# Patient Record
Sex: Female | Born: 1970
Health system: Southern US, Community
[De-identification: ages and names within clinical notes are randomized; demographics above are authoritative.]

## PROBLEM LIST (undated history)

## (undated) DIAGNOSIS — E119 Type 2 diabetes mellitus without complications: Secondary | ICD-10-CM

## (undated) HISTORY — PX: ABDOMINAL HYSTERECTOMY: SHX81

## (undated) HISTORY — PX: NO PAST SURGERIES: SHX2092

---

## 1997-10-27 ENCOUNTER — Emergency Department (HOSPITAL_COMMUNITY): Admission: EM | Admit: 1997-10-27 | Discharge: 1997-10-27 | Payer: Self-pay | Admitting: Emergency Medicine

## 1997-10-31 ENCOUNTER — Emergency Department (HOSPITAL_COMMUNITY): Admission: EM | Admit: 1997-10-31 | Discharge: 1997-11-01 | Payer: Self-pay | Admitting: Emergency Medicine

## 1997-12-10 ENCOUNTER — Emergency Department (HOSPITAL_COMMUNITY): Admission: EM | Admit: 1997-12-10 | Discharge: 1997-12-10 | Payer: Self-pay | Admitting: Emergency Medicine

## 1999-09-01 ENCOUNTER — Emergency Department (HOSPITAL_COMMUNITY): Admission: EM | Admit: 1999-09-01 | Discharge: 1999-09-01 | Payer: Self-pay | Admitting: Emergency Medicine

## 2000-03-17 ENCOUNTER — Emergency Department (HOSPITAL_COMMUNITY): Admission: EM | Admit: 2000-03-17 | Discharge: 2000-03-17 | Payer: Self-pay | Admitting: Emergency Medicine

## 2000-03-17 ENCOUNTER — Encounter: Payer: Self-pay | Admitting: Emergency Medicine

## 2000-03-21 ENCOUNTER — Emergency Department (HOSPITAL_COMMUNITY): Admission: EM | Admit: 2000-03-21 | Discharge: 2000-03-21 | Payer: Self-pay | Admitting: Emergency Medicine

## 2001-06-09 ENCOUNTER — Encounter: Payer: Self-pay | Admitting: Obstetrics and Gynecology

## 2001-06-09 ENCOUNTER — Ambulatory Visit (HOSPITAL_COMMUNITY): Admission: RE | Admit: 2001-06-09 | Discharge: 2001-06-09 | Payer: Self-pay | Admitting: Obstetrics and Gynecology

## 2002-06-01 ENCOUNTER — Other Ambulatory Visit: Admission: RE | Admit: 2002-06-01 | Discharge: 2002-06-01 | Payer: Self-pay | Admitting: Obstetrics and Gynecology

## 2002-10-20 ENCOUNTER — Emergency Department (HOSPITAL_COMMUNITY): Admission: EM | Admit: 2002-10-20 | Discharge: 2002-10-20 | Payer: Self-pay | Admitting: Unknown Physician Specialty

## 2004-05-29 ENCOUNTER — Emergency Department (HOSPITAL_COMMUNITY): Admission: EM | Admit: 2004-05-29 | Discharge: 2004-05-29 | Payer: Self-pay | Admitting: Family Medicine

## 2006-01-18 ENCOUNTER — Emergency Department (HOSPITAL_COMMUNITY): Admission: EM | Admit: 2006-01-18 | Discharge: 2006-01-19 | Payer: Self-pay | Admitting: Emergency Medicine

## 2006-09-03 ENCOUNTER — Emergency Department (HOSPITAL_COMMUNITY): Admission: EM | Admit: 2006-09-03 | Discharge: 2006-09-03 | Payer: Self-pay | Admitting: Emergency Medicine

## 2008-02-01 ENCOUNTER — Emergency Department (HOSPITAL_COMMUNITY): Admission: EM | Admit: 2008-02-01 | Discharge: 2008-02-01 | Payer: Self-pay | Admitting: Family Medicine

## 2008-07-03 ENCOUNTER — Emergency Department (HOSPITAL_COMMUNITY): Admission: EM | Admit: 2008-07-03 | Discharge: 2008-07-04 | Payer: Self-pay | Admitting: Emergency Medicine

## 2008-08-20 ENCOUNTER — Emergency Department (HOSPITAL_COMMUNITY): Admission: EM | Admit: 2008-08-20 | Discharge: 2008-08-20 | Payer: Self-pay | Admitting: Emergency Medicine

## 2009-02-07 ENCOUNTER — Emergency Department (HOSPITAL_COMMUNITY): Admission: EM | Admit: 2009-02-07 | Discharge: 2009-02-07 | Payer: Self-pay | Admitting: Emergency Medicine

## 2009-08-06 ENCOUNTER — Emergency Department (HOSPITAL_COMMUNITY): Admission: EM | Admit: 2009-08-06 | Discharge: 2009-08-06 | Payer: Self-pay | Admitting: Emergency Medicine

## 2010-05-24 ENCOUNTER — Encounter: Payer: Self-pay | Admitting: Obstetrics and Gynecology

## 2010-08-06 LAB — DIFFERENTIAL
Basophils Relative: 1 % (ref 0–1)
Eosinophils Relative: 8 % — ABNORMAL HIGH (ref 0–5)
Lymphocytes Relative: 12 % (ref 12–46)
Lymphs Abs: 1.3 10*3/uL (ref 0.7–4.0)
Monocytes Absolute: 0.4 10*3/uL (ref 0.1–1.0)

## 2010-08-06 LAB — CBC
MCV: 84.4 fL (ref 78.0–100.0)
RBC: 5.06 MIL/uL (ref 3.87–5.11)

## 2010-08-06 LAB — BASIC METABOLIC PANEL
CO2: 27 mEq/L (ref 19–32)
GFR calc Af Amer: >60 mL/min (ref >60)
Sodium: 137 mEq/L (ref 135–145)

## 2010-08-13 LAB — RAPID STREP SCREEN (MED CTR MEBANE ONLY): Streptococcus, Group A Screen (Direct): POSITIVE — AB

## 2010-08-28 ENCOUNTER — Emergency Department (HOSPITAL_COMMUNITY)
Admission: EM | Admit: 2010-08-28 | Discharge: 2010-08-28 | Disposition: A | Discharge status: Home / Self Care | Payer: 59 | Attending: Emergency Medicine | Admitting: Emergency Medicine

## 2010-08-28 ENCOUNTER — Emergency Department (HOSPITAL_COMMUNITY): Payer: 59

## 2010-08-28 DIAGNOSIS — R0989 Other specified symptoms and signs involving the circulatory and respiratory systems: Secondary | ICD-10-CM | POA: Insufficient documentation

## 2010-08-28 DIAGNOSIS — R110 Nausea: Secondary | ICD-10-CM | POA: Insufficient documentation

## 2010-08-28 DIAGNOSIS — R079 Chest pain, unspecified: Secondary | ICD-10-CM | POA: Insufficient documentation

## 2010-08-28 DIAGNOSIS — R0609 Other forms of dyspnea: Secondary | ICD-10-CM | POA: Insufficient documentation

## 2010-08-28 LAB — DIFFERENTIAL
Basophils Absolute: 0.1 10*3/uL (ref 0.0–0.1)
Eosinophils Absolute: 0.5 10*3/uL (ref 0.0–0.7)
Lymphocytes Relative: 24 % (ref 12–46)
Lymphs Abs: 1.6 10*3/uL (ref 0.7–4.0)
Monocytes Absolute: 0.5 10*3/uL (ref 0.1–1.0)
Monocytes Relative: 7 % (ref 3–12)
Neutro Abs: 4 10*3/uL (ref 1.7–7.7)
Neutrophils Relative %: 60 % (ref 43–77)

## 2010-08-28 LAB — CBC
HCT: 44.5 % (ref 36.0–46.0)
MCHC: 33.3 g/dL (ref 30.0–36.0)
MCV: 82.3 fL (ref 78.0–100.0)
RBC: 5.41 MIL/uL — ABNORMAL HIGH (ref 3.87–5.11)
RDW: 14.5 % (ref 11.5–15.5)

## 2010-08-28 LAB — CK TOTAL AND CKMB (NOT AT ARMC)
CK, MB: 1.1 ng/mL (ref 0.3–4.0)
Relative Index: 1 (ref 0.0–2.5)

## 2010-08-28 LAB — COMPREHENSIVE METABOLIC PANEL
GFR calc Af Amer: >60 mL/min (ref >60)
GFR calc non Af Amer: >60 mL/min (ref >60)
Sodium: 137 mEq/L (ref 135–145)
Total Bilirubin: 0.7 mg/dL (ref 0.3–1.2)
Total Protein: 7.6 g/dL (ref 6.0–8.3)

## 2010-11-26 ENCOUNTER — Encounter (HOSPITAL_COMMUNITY): Payer: Self-pay

## 2010-11-26 ENCOUNTER — Encounter (HOSPITAL_COMMUNITY)
Admission: RE | Admit: 2010-11-26 | Discharge: 2010-11-26 | Disposition: A | Discharge status: Home / Self Care | Payer: 59 | Source: Ambulatory Visit | Attending: Obstetrics and Gynecology | Admitting: Obstetrics and Gynecology

## 2010-11-26 LAB — CBC
Hemoglobin: 14.3 g/dL (ref 12.0–15.0)
MCH: 28 pg (ref 26.0–34.0)
Platelets: 199 10*3/uL (ref 150–400)
WBC: 6.4 10*3/uL (ref 4.0–10.5)

## 2010-11-26 MED ORDER — METRONIDAZOLE IN NACL 5-0.79 MG/ML-% IV SOLN: 500.0000 mg | Freq: Three times a day (TID) | INTRAVENOUS | Status: DC

## 2010-11-26 MED ORDER — CEFAZOLIN SODIUM 1-5 GM-% IV SOLN: 1.0000 g | Freq: Once | INTRAVENOUS | Status: DC

## 2010-11-26 NOTE — Patient Instructions (Addendum)
[name]  11/26/2010   Your procedure is scheduled on:  11/30/10  Report to [facility] at 1130 AM.  Call this number if you have problems the morning of surgery: [phone]   Remember:   Do not eat food:After Midnight.  Do not drink clear liquids: 4 Hours before arrival.  Take these medicines the morning of surgery with A SIP OF WATER: NA   Do not wear jewelry, make-up or nail polish.  Do not bring valuables to the hospital.  Contacts, dentures or bridgework may not be worn into surgery.  Leave suitcase in the car. After surgery it may be brought to your room.  For patients admitted to the hospital, checkout time is 11:00 AM the day of discharge.   Patients discharged the day of surgery will not be allowed to drive home.  Name and phone number of your driver: NA  Special Instructions: CHG wash 1/2 bottle PM before surgery and 1/2 bottle AM of surgery   Please read over the following fact sheets that you were given: Pain Booklet and Surgical Site Infection Prevention

## 2010-11-29 MED ORDER — CEFAZOLIN SODIUM-DEXTROSE 2-3 GM-% IV SOLR
2.0000 g | INTRAVENOUS | Status: DC
  Filled 2010-11-29: qty 50

## 2010-11-29 MED ORDER — METRONIDAZOLE IN NACL 5-0.79 MG/ML-% IV SOLN
500.0000 mg | Freq: Once | INTRAVENOUS | Status: DC
  Filled 2010-11-29: qty 100

## 2010-11-30 ENCOUNTER — Ambulatory Visit (HOSPITAL_COMMUNITY)
Admission: RE | Admit: 2010-11-30 | Discharge: 2010-12-01 | Disposition: A | Discharge status: Home / Self Care | Payer: 59 | Source: Ambulatory Visit | Attending: Obstetrics and Gynecology | Admitting: Obstetrics and Gynecology

## 2010-11-30 ENCOUNTER — Other Ambulatory Visit: Payer: Self-pay | Admitting: Obstetrics and Gynecology

## 2010-11-30 ENCOUNTER — Encounter (HOSPITAL_COMMUNITY)
Admission: RE | Disposition: A | Discharge status: Home / Self Care | Payer: Self-pay | Source: Ambulatory Visit | Attending: Obstetrics and Gynecology

## 2010-11-30 ENCOUNTER — Encounter (HOSPITAL_COMMUNITY): Payer: Self-pay | Admitting: Anesthesiology

## 2010-11-30 ENCOUNTER — Ambulatory Visit (HOSPITAL_COMMUNITY): Payer: 59 | Admitting: Anesthesiology

## 2010-11-30 DIAGNOSIS — N946 Dysmenorrhea, unspecified: Principal | ICD-10-CM | POA: Insufficient documentation

## 2010-11-30 DIAGNOSIS — N920 Excessive and frequent menstruation with regular cycle: Secondary | ICD-10-CM | POA: Insufficient documentation

## 2010-11-30 DIAGNOSIS — Z90710 Acquired absence of both cervix and uterus: Secondary | ICD-10-CM | POA: Diagnosis not present

## 2010-11-30 DIAGNOSIS — N7011 Chronic salpingitis: Secondary | ICD-10-CM | POA: Diagnosis present

## 2010-11-30 DIAGNOSIS — Z01818 Encounter for other preprocedural examination: Secondary | ICD-10-CM | POA: Insufficient documentation

## 2010-11-30 DIAGNOSIS — N871 Moderate cervical dysplasia: Secondary | ICD-10-CM | POA: Insufficient documentation

## 2010-11-30 DIAGNOSIS — Z01812 Encounter for preprocedural laboratory examination: Secondary | ICD-10-CM | POA: Insufficient documentation

## 2010-11-30 DIAGNOSIS — N736 Female pelvic peritoneal adhesions (postinfective): Secondary | ICD-10-CM | POA: Diagnosis present

## 2010-11-30 DIAGNOSIS — N7013 Chronic salpingitis and oophoritis: Secondary | ICD-10-CM | POA: Insufficient documentation

## 2010-11-30 SURGERY — ROBOTIC ASSISTED TOTAL HYSTERECTOMY
Anesthesia: General | Site: Uterus | Wound class: Clean Contaminated

## 2010-11-30 MED ORDER — ROCURONIUM BROMIDE 100 MG/10ML IV SOLN
INTRAVENOUS | Status: DC | PRN
  Administered 2010-11-30 (×3): 10 mg via INTRAVENOUS
  Administered 2010-11-30: 45 mg via INTRAVENOUS
  Administered 2010-11-30: 5 mg via INTRAVENOUS

## 2010-11-30 MED ORDER — LIDOCAINE HCL (CARDIAC) 20 MG/ML IV SOLN
INTRAVENOUS | Status: DC | PRN
  Administered 2010-11-30: 100 mg via INTRAVENOUS

## 2010-11-30 MED ORDER — DIPHENHYDRAMINE HCL 12.5 MG/5ML PO ELIX: 12.5000 mg | ORAL_SOLUTION | Freq: Four times a day (QID) | ORAL | Status: DC | PRN

## 2010-11-30 MED ORDER — CEFAZOLIN SODIUM 1-5 GM-% IV SOLN
INTRAVENOUS | Status: AC
  Filled 2010-11-30: qty 50

## 2010-11-30 MED ORDER — CITRIC ACID-SODIUM CITRATE 334-500 MG/5ML PO SOLN: 30.0000 mL | Freq: Once | ORAL | Status: DC | PRN

## 2010-11-30 MED ORDER — OXYCODONE-ACETAMINOPHEN 5-325 MG PO TABS
1.0000 | ORAL_TABLET | ORAL | Status: DC | PRN
  Administered 2010-12-01: 2 via ORAL
  Filled 2010-11-30: qty 2

## 2010-11-30 MED ORDER — INSULIN REGULAR HUMAN 100 UNIT/ML IJ SOLN
INTRAMUSCULAR | Status: AC
  Filled 2010-11-30: qty 10

## 2010-11-30 MED ORDER — ACETAMINOPHEN 10 MG/ML IV SOLN
1000.0000 mg | Freq: Once | INTRAVENOUS | Status: AC | PRN
  Administered 2010-11-30: 1000 mg via INTRAVENOUS
  Filled 2010-11-30: qty 100

## 2010-11-30 MED ORDER — NEOSTIGMINE METHYLSULFATE 1 MG/ML IJ SOLN
INTRAMUSCULAR | Status: AC
  Filled 2010-11-30: qty 10

## 2010-11-30 MED ORDER — BISACODYL 10 MG RE SUPP: 10.0000 mg | Freq: Every day | RECTAL | Status: DC | PRN

## 2010-11-30 MED ORDER — MEPERIDINE HCL 25 MG/ML IJ SOLN: 6.2500 mg | INTRAMUSCULAR | Status: DC | PRN

## 2010-11-30 MED ORDER — BUPIVACAINE HCL (PF) 0.25 % IJ SOLN
INTRAMUSCULAR | Status: DC | PRN
  Administered 2010-11-30: 17 mg

## 2010-11-30 MED ORDER — HYDROMORPHONE 0.3 MG/ML IV SOLN
INTRAVENOUS | Status: AC
  Filled 2010-11-30: qty 25

## 2010-11-30 MED ORDER — ACETAMINOPHEN 325 MG PO TABS: 325.0000 mg | ORAL_TABLET | ORAL | Status: DC | PRN

## 2010-11-30 MED ORDER — PANTOPRAZOLE SODIUM 40 MG PO TBEC
40.0000 mg | DELAYED_RELEASE_TABLET | Freq: Every day | ORAL | Status: DC
  Administered 2010-11-30: 40 mg via ORAL
  Filled 2010-11-30 (×2): qty 1

## 2010-11-30 MED ORDER — ONDANSETRON HCL 4 MG/2ML IJ SOLN
INTRAMUSCULAR | Status: AC
  Filled 2010-11-30: qty 2

## 2010-11-30 MED ORDER — FAMOTIDINE 20 MG PO TABS: 20.0000 mg | ORAL_TABLET | Freq: Once | ORAL | Status: DC | PRN

## 2010-11-30 MED ORDER — PROMETHAZINE HCL 25 MG/ML IJ SOLN: 6.2500 mg | INTRAMUSCULAR | Status: DC | PRN

## 2010-11-30 MED ORDER — KETOROLAC TROMETHAMINE 60 MG/2ML IM SOLN
INTRAMUSCULAR | Status: AC
  Filled 2010-11-30: qty 2

## 2010-11-30 MED ORDER — LIDOCAINE HCL (CARDIAC) 20 MG/ML IV SOLN
INTRAVENOUS | Status: AC
  Filled 2010-11-30: qty 5

## 2010-11-30 MED ORDER — KETOROLAC TROMETHAMINE 30 MG/ML IJ SOLN
INTRAMUSCULAR | Status: DC | PRN
  Administered 2010-11-30: 30 mg via INTRAVENOUS

## 2010-11-30 MED ORDER — GLYCOPYRROLATE 0.2 MG/ML IJ SOLN
INTRAMUSCULAR | Status: AC
  Filled 2010-11-30: qty 1

## 2010-11-30 MED ORDER — CEFAZOLIN SODIUM 1-5 GM-% IV SOLN
INTRAVENOUS | Status: DC | PRN
  Administered 2010-11-30: 2 g via INTRAVENOUS

## 2010-11-30 MED ORDER — ONDANSETRON HCL 4 MG PO TABS: 4.0000 mg | ORAL_TABLET | Freq: Four times a day (QID) | ORAL | Status: DC | PRN

## 2010-11-30 MED ORDER — SENNOSIDES-DOCUSATE SODIUM 8.6-50 MG PO TABS: 2.0000 | ORAL_TABLET | Freq: Every day | ORAL | Status: DC | PRN

## 2010-11-30 MED ORDER — HYDROMORPHONE HCL 1 MG/ML IJ SOLN: 0.2000 mg | INTRAMUSCULAR | Status: DC | PRN

## 2010-11-30 MED ORDER — SIMETHICONE 80 MG PO CHEW: 80.0000 mg | CHEWABLE_TABLET | Freq: Four times a day (QID) | ORAL | Status: DC | PRN

## 2010-11-30 MED ORDER — NEOSTIGMINE METHYLSULFATE 1 MG/ML IJ SOLN
INTRAMUSCULAR | Status: DC | PRN
  Administered 2010-11-30: 3 mg via INTRAMUSCULAR

## 2010-11-30 MED ORDER — NALOXONE HCL 0.4 MG/ML IJ SOLN: 0.4000 mg | INTRAMUSCULAR | Status: DC | PRN

## 2010-11-30 MED ORDER — HYDROMORPHONE HCL 1 MG/ML IJ SOLN
0.2500 mg | INTRAMUSCULAR | Status: DC | PRN
  Administered 2010-11-30 (×2): 0.5 mg via INTRAVENOUS

## 2010-11-30 MED ORDER — METRONIDAZOLE IN NACL 5-0.79 MG/ML-% IV SOLN
INTRAVENOUS | Status: DC | PRN
  Administered 2010-11-30: 500 mg via INTRAVENOUS

## 2010-11-30 MED ORDER — DEXTROSE IN LACTATED RINGERS 5 % IV SOLN
INTRAVENOUS | Status: DC
  Administered 2010-11-30 – 2010-12-01 (×2): via INTRAVENOUS

## 2010-11-30 MED ORDER — PANTOPRAZOLE SODIUM 40 MG PO TBEC: 40.0000 mg | DELAYED_RELEASE_TABLET | Freq: Once | ORAL | Status: DC | PRN

## 2010-11-30 MED ORDER — ONDANSETRON HCL 4 MG/2ML IJ SOLN
INTRAMUSCULAR | Status: DC | PRN
  Administered 2010-11-30: 4 mg via INTRAVENOUS

## 2010-11-30 MED ORDER — PHENYLEPHRINE 40 MCG/ML (10ML) SYRINGE FOR IV PUSH (FOR BLOOD PRESSURE SUPPORT)
PREFILLED_SYRINGE | INTRAVENOUS | Status: AC
  Filled 2010-11-30: qty 5

## 2010-11-30 MED ORDER — GLYCOPYRROLATE 0.2 MG/ML IJ SOLN
INTRAMUSCULAR | Status: DC | PRN
  Administered 2010-11-30: 0.2 mg via INTRAVENOUS
  Administered 2010-11-30: .4 mg via INTRAVENOUS

## 2010-11-30 MED ORDER — DIPHENHYDRAMINE HCL 50 MG/ML IJ SOLN: 12.5000 mg | Freq: Four times a day (QID) | INTRAMUSCULAR | Status: DC | PRN

## 2010-11-30 MED ORDER — HYDROMORPHONE HCL 1 MG/ML IJ SOLN
INTRAMUSCULAR | Status: AC
  Administered 2010-11-30: 0.5 mg via INTRAVENOUS
  Filled 2010-11-30: qty 1

## 2010-11-30 MED ORDER — ALUM & MAG HYDROXIDE-SIMETH 200-200-20 MG/5ML PO SUSP: 30.0000 mL | ORAL | Status: DC | PRN

## 2010-11-30 MED ORDER — KETOROLAC TROMETHAMINE 30 MG/ML IJ SOLN
INTRAMUSCULAR | Status: AC
  Filled 2010-11-30: qty 1

## 2010-11-30 MED ORDER — MIDAZOLAM HCL 2 MG/2ML IJ SOLN
INTRAMUSCULAR | Status: AC
  Filled 2010-11-30: qty 2

## 2010-11-30 MED ORDER — ROCURONIUM BROMIDE 50 MG/5ML IV SOLN
INTRAVENOUS | Status: AC
  Filled 2010-11-30: qty 1

## 2010-11-30 MED ORDER — CEFAZOLIN SODIUM 1-5 GM-% IV SOLN
1.0000 g | Freq: Three times a day (TID) | INTRAVENOUS | Status: AC
  Administered 2010-11-30 – 2010-12-01 (×2): 1 g via INTRAVENOUS
  Filled 2010-11-30 (×2): qty 50

## 2010-11-30 MED ORDER — LACTATED RINGERS IV SOLN
INTRAVENOUS | Status: DC
  Administered 2010-11-30 (×4): via INTRAVENOUS

## 2010-11-30 MED ORDER — METRONIDAZOLE IN NACL 5-0.79 MG/ML-% IV SOLN
500.0000 mg | Freq: Two times a day (BID) | INTRAVENOUS | Status: DC
  Administered 2010-11-30 – 2010-12-01 (×2): 500 mg via INTRAVENOUS
  Filled 2010-11-30 (×2): qty 100

## 2010-11-30 MED ORDER — PHENYLEPHRINE HCL 10 MG/ML IJ SOLN
INTRAMUSCULAR | Status: DC | PRN
  Administered 2010-11-30: 80 ug via INTRAVENOUS

## 2010-11-30 MED ORDER — DEXAMETHASONE SODIUM PHOSPHATE 10 MG/ML IJ SOLN
INTRAMUSCULAR | Status: AC
  Filled 2010-11-30: qty 1

## 2010-11-30 MED ORDER — FENTANYL CITRATE 0.05 MG/ML IJ SOLN
INTRAMUSCULAR | Status: AC
  Filled 2010-11-30: qty 10

## 2010-11-30 MED ORDER — ONDANSETRON HCL 4 MG/2ML IJ SOLN: 4.0000 mg | Freq: Four times a day (QID) | INTRAMUSCULAR | Status: DC | PRN

## 2010-11-30 MED ORDER — MENTHOL 3 MG MT LOZG: 1.0000 | LOZENGE | OROMUCOSAL | Status: DC | PRN

## 2010-11-30 MED ORDER — FENTANYL CITRATE 0.05 MG/ML IJ SOLN
INTRAMUSCULAR | Status: DC | PRN
  Administered 2010-11-30: 25 ug via INTRAVENOUS
  Administered 2010-11-30: 50 ug via INTRAVENOUS
  Administered 2010-11-30: 25 ug via INTRAVENOUS
  Administered 2010-11-30: 50 ug via INTRAVENOUS
  Administered 2010-11-30: 100 ug via INTRAVENOUS
  Administered 2010-11-30 (×2): 50 ug via INTRAVENOUS
  Administered 2010-11-30: 100 ug via INTRAVENOUS

## 2010-11-30 MED ORDER — DOCUSATE SODIUM 100 MG PO CAPS
100.0000 mg | ORAL_CAPSULE | Freq: Every day | ORAL | Status: DC
  Administered 2010-11-30 – 2010-12-01 (×2): 100 mg via ORAL
  Filled 2010-11-30 (×2): qty 1

## 2010-11-30 MED ORDER — SODIUM CHLORIDE 0.9 % IJ SOLN: 9.0000 mL | INTRAMUSCULAR | Status: DC | PRN

## 2010-11-30 MED ORDER — PROPOFOL 10 MG/ML IV EMUL
INTRAVENOUS | Status: AC
  Filled 2010-11-30: qty 50

## 2010-11-30 MED ORDER — ZOLPIDEM TARTRATE 5 MG PO TABS: 5.0000 mg | ORAL_TABLET | Freq: Every evening | ORAL | Status: DC | PRN

## 2010-11-30 MED ORDER — MIDAZOLAM HCL 5 MG/5ML IJ SOLN
INTRAMUSCULAR | Status: DC | PRN
  Administered 2010-11-30: 2 mg via INTRAVENOUS

## 2010-11-30 MED ORDER — MIDAZOLAM HCL 2 MG/2ML IJ SOLN: 0.5000 mg | INTRAMUSCULAR | Status: DC | PRN

## 2010-11-30 MED ORDER — LACTATED RINGERS IR SOLN
Status: DC | PRN
  Administered 2010-11-30: 1500 mL

## 2010-11-30 MED ORDER — IBUPROFEN 800 MG PO TABS
800.0000 mg | ORAL_TABLET | Freq: Three times a day (TID) | ORAL | Status: DC | PRN
  Administered 2010-12-01: 800 mg via ORAL
  Filled 2010-11-30: qty 1

## 2010-11-30 MED ORDER — HYDROMORPHONE 0.3 MG/ML IV SOLN
INTRAVENOUS | Status: DC
  Administered 2010-11-30: 0.4 mg via INTRAVENOUS
  Administered 2010-11-30: 19:00:00 via INTRAVENOUS
  Administered 2010-12-01: 0.799 mg via INTRAVENOUS
  Administered 2010-12-01: 0.599 mg via INTRAVENOUS
  Administered 2010-12-01: 1.9 via INTRAVENOUS

## 2010-11-30 MED ORDER — DEXAMETHASONE SODIUM PHOSPHATE 4 MG/ML IJ SOLN
INTRAMUSCULAR | Status: DC | PRN
  Administered 2010-11-30: 10 mg via INTRAVENOUS

## 2010-11-30 MED ORDER — PROPOFOL 10 MG/ML IV EMUL
INTRAVENOUS | Status: DC | PRN
  Administered 2010-11-30: 180 mg via INTRAVENOUS

## 2010-11-30 SURGICAL SUPPLY — 55 items
BARRIER ADHS 3X4 INTERCEED (GAUZE/BANDAGES/DRESSINGS) ×3 IMPLANT
BLADELESS LONG 8MM (BLADE) ×3 IMPLANT
BRR ADH 4X3 ABS CNTRL BYND (GAUZE/BANDAGES/DRESSINGS) ×2
CABLE HIGH FREQUENCY MONO STRZ (ELECTRODE) ×3 IMPLANT
CONT PATH 16OZ SNAP LID 3702 (MISCELLANEOUS) ×3 IMPLANT
COVER MAYO STAND STRL (DRAPES) ×3 IMPLANT
COVER TABLE BACK 60X90 (DRAPES) ×6 IMPLANT
COVER TIP SHEARS 8 DVNC (MISCELLANEOUS) ×2 IMPLANT
COVER TIP SHEARS 8MM DA VINCI (MISCELLANEOUS) ×1
DECANTER SPIKE VIAL GLASS SM (MISCELLANEOUS) ×1 IMPLANT
DERMABOND ADVANCED (GAUZE/BANDAGES/DRESSINGS) ×3 IMPLANT
DRAPE HUG U DISPOSABLE (DRAPE) ×3 IMPLANT
DRAPE LG THREE QUARTER DISP (DRAPES) ×6 IMPLANT
DRAPE MONITOR DA VINCI (DRAPE) ×3 IMPLANT
DRAPE UTILITY XL STRL (DRAPES) ×1 IMPLANT
DRAPE WARM FLUID 44X44 (DRAPE) ×3 IMPLANT
ELECT REM PT RETURN 9FT ADLT (ELECTROSURGICAL) ×3
ELECTRODE REM PT RTRN 9FT ADLT (ELECTROSURGICAL) ×2 IMPLANT
EVACUATOR SMOKE 8.L (FILTER) ×3 IMPLANT
GAUZE VASELINE 3X9 (GAUZE/BANDAGES/DRESSINGS) ×2 IMPLANT
GLOVE BIO SURGEON STRL SZ 6.5 (GLOVE) ×14 IMPLANT
GLOVE BIOGEL PI IND STRL 7.0 (GLOVE) ×8 IMPLANT
GLOVE BIOGEL PI INDICATOR 7.0 (GLOVE) ×5
GOWN PREVENTION PLUS LG XLONG (DISPOSABLE) ×16 IMPLANT
KIT DISP ACCESSORY 4 ARM (KITS) ×3 IMPLANT
NDL INSUFFLATION 14GA 120MM (NEEDLE) ×1 IMPLANT
NEEDLE INSUFFLATION 14GA 120MM (NEEDLE) ×3 IMPLANT
NS IRRIG 1000ML POUR BTL (IV SOLUTION) ×9 IMPLANT
OCCLUDER COLPOPNEUMO (BALLOONS) ×2 IMPLANT
PACK LAVH (CUSTOM PROCEDURE TRAY) ×3 IMPLANT
PAD PREP 24X48 CUFFED NSTRL (MISCELLANEOUS) ×6 IMPLANT
RUMI II 3.0CM BLUE KOH-EFFICIE (DISPOSABLE) ×4 IMPLANT
SCISSORS LAP 5X35 DISP (ENDOMECHANICALS) ×2 IMPLANT
SET IRRIG TUBING LAPAROSCOPIC (IRRIGATION / IRRIGATOR) ×3 IMPLANT
SOLUTION ELECTROLUBE (MISCELLANEOUS) ×3 IMPLANT
SPONGE LAP 18X18 X RAY DECT (DISPOSABLE) IMPLANT
SUT VIC AB 0 CT1 27 (SUTURE) ×15
SUT VIC AB 0 CT1 27XBRD ANTBC (SUTURE) ×10 IMPLANT
SUT VIC AB 4-0 PS2 18 (SUTURE) ×6 IMPLANT
SUT VICRYL 0 UR6 27IN ABS (SUTURE) ×5 IMPLANT
SYR 50ML LL SCALE MARK (SYRINGE) ×3 IMPLANT
SYSTEM CONVERTIBLE TROCAR (TROCAR) IMPLANT
TIP UTERINE 5.1X6CM LAV DISP (MISCELLANEOUS) IMPLANT
TIP UTERINE 6.7X10CM GRN DISP (MISCELLANEOUS) IMPLANT
TIP UTERINE 6.7X6CM WHT DISP (MISCELLANEOUS) IMPLANT
TIP UTERINE 6.7X8CM BLUE DISP (MISCELLANEOUS) ×4 IMPLANT
TOWEL OR 17X24 6PK STRL BLUE (TOWEL DISPOSABLE) ×9 IMPLANT
TRAY FOLEY BAG SILVER LF 14FR (CATHETERS) ×3 IMPLANT
TROCAR 12M 150ML BLUNT (TROCAR) IMPLANT
TROCAR DISP BLADELESS 8 DVNC (TROCAR) ×2 IMPLANT
TROCAR DISP BLADELESS 8MM (TROCAR) ×1
TROCAR Z-THREAD 12X150 (TROCAR) ×3 IMPLANT
TROCAR Z-THREAD BLADED 12X100M (TROCAR) ×1 IMPLANT
TUBING FILTER THERMOFLATOR (ELECTROSURGICAL) ×3 IMPLANT
WARMER LAPAROSCOPE (MISCELLANEOUS) ×3 IMPLANT

## 2010-11-30 NOTE — Anesthesia Procedure Notes (Signed)
Procedures

## 2010-11-30 NOTE — Anesthesia Preprocedure Evaluation (Signed)
Anesthesia Evaluation  Name, MR# and DOB Patient awake  General Assessment Comment  Reviewed: Allergy & Precautions, H&P , Patient's Chart, lab work & pertinent test results and reviewed documented beta blocker date and time   History of Anesthesia Complications Negative for: history of anesthetic complications  Airway Mallampati: II TM Distance: >3 FB Neck ROM: full    Dental No notable dental hx    Pulmonaryneg pulmonary ROS    clear to auscultation  pulmonary exam normal   Cardiovascular Exercise Tolerance: Good regular Normal   Neuro/PsychNegative Neurological ROS Negative Psych ROS  GI/Hepatic/Renal negative GI ROS, negative Liver ROS, and negative Renal ROS (+)       Endo/Other  Negative Endocrine ROS (+)   Abdominal   Musculoskeletal  Hematology negative hematology ROS (+)   Peds  Reproductive/Obstetrics negative OB ROS   Anesthesia Other Findings             Anesthesia Physical Anesthesia Plan  ASA: II  Anesthesia Plan: General   Post-op Pain Management:    Induction:   Airway Management Planned:   Additional Equipment:   Intra-op Plan:   Post-operative Plan:   Informed Consent: I have reviewed the patients History and Physical, chart, labs and discussed the procedure including the risks, benefits and alternatives for the proposed anesthesia with the patient or authorized representative who has indicated his/her understanding and acceptance.   Dental Advisory Given  Plan Discussed with: CRNA and Surgeon  Anesthesia Plan Comments:        Anesthesia Quick Evaluation  

## 2010-11-30 NOTE — Transfer of Care (Signed)
Immediate Anesthesia Transfer of Care Note  Patient: April [name]  Procedure(s) Performed:  ROBOTIC ASSISTED TOTAL HYSTERECTOMY - bilateral salpingoophorectomy  Patient Location: PACU  Anesthesia Type: General  Level of Consciousness: awake, alert , oriented, patient cooperative and responds to stimulation  Airway & Oxygen Therapy: Patient Spontanous Breathing and Patient connected to nasal cannula oxygen  Post-op Assessment: Report given to PACU RN, Post -op Vital signs reviewed and stable and Patient moving all extremities  Post vital signs: Reviewed and stable  Complications: No apparent anesthesia complications

## 2010-11-30 NOTE — Anesthesia Postprocedure Evaluation (Signed)
  Anesthesia Post-op Note  Patient: April [name]  Procedure(s) Performed:  ROBOTIC ASSISTED TOTAL HYSTERECTOMY - bilateral salpingogectomy  Anesthesia Post Note  Patient: April [name]  Procedure(s) Performed:  ROBOTIC ASSISTED TOTAL HYSTERECTOMY - bilateral salpingogectomy  Anesthesia type: General  Patient location: PACU  Post pain: Pain level controlled  Post assessment: Post-op Vital signs reviewed, Patient's Cardiovascular Status Stable, Respiratory Function Stable, Patent Airway and Pain level controlled  Last Vitals:  Filed Vitals:   11/30/10 1800  BP: 116/55  Pulse: 51  Temp: 97.6 F (36.4 C)  Resp: 17    Post vital signs: Reviewed and stable  Level of consciousness: awake, alert  and oriented  Complications: No apparent anesthesia complications

## 2010-11-30 NOTE — Brief Op Note (Signed)
11/30/2010  4:57 PM  PATIENT:  April [name]  40 y.o. female  PRE-OPERATIVE DIAGNOSIS:  Dysmenorrhea; Menorrhagia; Cervical Intraepithelial Neoplasia; Possible Bilateral Hydro-Salpinx  POST-OPERATIVE DIAGNOSIS:  Dysmenorrhea; Menorrhagia; Cervical Intraepithelial Neoplasia; Bilateral Hydro-Salpinx  PROCEDURE:  Procedure(s): ROBOTIC ASSISTED TOTAL HYSTERECTOMY BILATERAL SALPINGECTOMY  SURGEON:  Surgeon(s): Serita Kyle, MD  PHYSICIAN ASSISTANT:   ASSISTANTS: Genia Del, M.D.   ANESTHESIA:   general  ESTIMATED BLOOD LOSS: 35cc  BLOOD ADMINISTERED:none   FINDINGS: Fibroid uterus, bilateral hydrosalpinx Fitz-Hugh curtis adhesions  Nl periovarian adhesions.  DRAINS: none   LOCAL MEDICATIONS USED:  MARCAINE 7CC  SPECIMEN:  Source of Specimen:  UTERUS WITH CERVIX, BOTH FALLOPIAN TUBES  DISPOSITION OF SPECIMEN:  PATHOLOGY  COUNTS:  YES  TOURNIQUET:  * No tourniquets in log *  DICTATION #: DONE  PLAN OF CARE: ADMIT  PATIENT DISPOSITION:  PACU - hemodynamically stable.   Delay start of Pharmacological VTE agent (>24hrs) due to surgical blood loss or risk of bleeding:  no   I/O: 2000CC/260CC EBL; 35 cc

## 2010-12-01 DIAGNOSIS — N7011 Chronic salpingitis: Secondary | ICD-10-CM | POA: Diagnosis present

## 2010-12-01 DIAGNOSIS — Z90710 Acquired absence of both cervix and uterus: Secondary | ICD-10-CM | POA: Diagnosis not present

## 2010-12-01 DIAGNOSIS — N736 Female pelvic peritoneal adhesions (postinfective): Secondary | ICD-10-CM | POA: Diagnosis present

## 2010-12-01 LAB — CBC
HCT: 35.8 % — ABNORMAL LOW (ref 36.0–46.0)
Hemoglobin: 11.9 g/dL — ABNORMAL LOW (ref 12.0–15.0)
MCH: 27.3 pg (ref 26.0–34.0)
MCV: 82.1 fL (ref 78.0–100.0)
RBC: 4.36 MIL/uL (ref 3.87–5.11)
WBC: 13.8 10*3/uL — ABNORMAL HIGH (ref 4.0–10.5)

## 2010-12-01 LAB — BASIC METABOLIC PANEL
BUN: 4 mg/dL — ABNORMAL LOW (ref 6–23)
CO2: 26 mEq/L (ref 19–32)
Calcium: 9.3 mg/dL (ref 8.4–10.5)
Chloride: 104 mEq/L (ref 96–112)
Creatinine, Ser: 0.69 mg/dL (ref 0.50–1.10)
Glucose, Bld: 151 mg/dL — ABNORMAL HIGH (ref 70–99)

## 2010-12-01 MED ORDER — OXYCODONE-ACETAMINOPHEN 5-500 MG PO CAPS: 1.0000 | ORAL_CAPSULE | ORAL | Status: AC | PRN

## 2010-12-01 MED ORDER — IBUPROFEN 800 MG PO TABS: 800.0000 mg | ORAL_TABLET | Freq: Three times a day (TID) | ORAL | Status: AC | PRN

## 2010-12-01 MED ORDER — PNEUMOCOCCAL VAC POLYVALENT 25 MCG/0.5ML IJ INJ
0.5000 mL | INJECTION | Freq: Once | INTRAMUSCULAR | Status: AC
  Administered 2010-12-01: 0.5 mL via INTRAMUSCULAR
  Filled 2010-12-01: qty 0.5

## 2010-12-01 NOTE — Discharge Summary (Signed)
Physician Discharge Summary  Patient ID: April [name] MRN: [id number] DOB/AGE: [birth year] 40 y.o.  Admit date: 11/30/2010 Discharge date: 12/01/2010  Admission Diagnoses: dysmenorrhea,menorrhagia, uterine fibroids, poss bilateral hydrosalpinx  Discharge Diagnoses: uterine fibroids, bilateral hydrosalpinx, dysmenorrhea, pelvic adhesions  Procedures: Davinci robotic TLH, bilateral salpingectomy   Discharged Condition: stable  Hospital Course: uncomplicated  Consults: none  Significant Diagnostic Studies: labs: wbc: 13.8, plt ct 151K, hgb 11.9 Treatments: surgery: see procedure  Discharge Exam: Blood pressure 116/70, pulse 86, temperature 98.8 F (37.1 C), temperature source Oral, resp. rate 16, last menstrual period 11/29/2010, SpO2 98.00%. see progress note  Disposition: Home or Self Care  Discharge Orders    Future Orders Please Complete By Expires   Diet general      Discharge instructions      Comments:   Call if temperature greater than equal to 100.4, nothing per vagina for 4-6 weeks or severe nausea vomiting, increased incisional pain , drainage or redness in the incision site, no straining with bowel movements, showers no bath   Driving Restrictions      Comments:   For 2 wk   Lifting restrictions      Comments:   No lifting > 25 lbs   No dressing needed      May walk up steps      Discharge patient        Current Discharge Medication List    START taking these medications   Details  ibuprofen (ADVIL,MOTRIN) 800 MG tablet Take 1 tablet (800 mg total) by mouth every 8 (eight) hours as needed for pain. Qty: 30 tablet, Refills: 3    oxyCODONE-acetaminophen (TYLOX) 5-500 MG per capsule Take 1 capsule by mouth every 4 (four) hours as needed for pain. Qty: 30 capsule, Refills: 0      CONTINUE these medications which have NOT CHANGED   Details  HYDROcodone-ibuprofen (VICOPROFEN) 7.5-200 MG per tablet Take 1 tablet by mouth every 4 (four) hours as  needed. For pain          Signed: Kennita Pavlovich A 12/01/2010, 9:18 AM

## 2010-12-01 NOTE — Progress Notes (Signed)
1 Day Post-Op Procedure(s): ROBOTIC ASSISTED TOTAL HYSTERECTOMY, bilateral salpingectomy  Subjective: Patient reports tolerating PO and no problems voiding.    Objective: I have reviewed patient's vital signs, intake and output and labs. VS: BP 116/70  P86 Temp 98.8  General: alert, cooperative and no distress Resp: clear to auscultation bilaterally Cardio: regular rate and rhythm, S1, S2 normal, no murmur, click, rub or gallop GI: soft, non-tender; bowel sounds normal; no masses,  no organomegaly and incision: clean, dry and intact Extremities: no edema, redness or tenderness in the calves or thighs Vaginal Bleeding: minimal LABS: wbc 13.9  hgb 11.9 plt count 151.  K3.6, Na 136, creat0.69 Assessment: s/p Procedure(s): ROBOTIC ASSISTED TOTAL HYSTERECTOMY, bilateral slapingectomy: stable  Plan: Discharge home f/u 2 wk. d/c instructions reviewed  LOS: 1 day    Thamar Holik A 12/01/2010, 9:04 AM

## 2010-12-01 NOTE — Op Note (Signed)
April [name], [name] NO.:  [id number]  MEDICAL RECORD NO.:  [id number]  LOCATION:  9315                          FACILITY:  [facility]  PHYSICIAN:  April [name], M.D.DATE OF BIRTH:  1971-04-04  DATE OF PROCEDURE:  11/30/2010 DATE OF DISCHARGE:                              OPERATIVE REPORT   PREOPERATIVE DIAGNOSES:  Dysmenorrhea, menorrhagia, CIN 2, possible bilateral hydrosalpinx.  POSTOPERATIVE DIAGNOSES:  Dysmenorrhea, menorrhagia, bilateral hydrosalpinx, uterine fibroids, pelvic adhesions, CIN 2.  PROCEDURE:  Da Vinci robotic assisted total laparoscopic hysterectomy, bilateral salpingectomy.  ANESTHESIA:  General.  SURGEON:  April Better, MD  ASSISTANT:  April Del, MD  PROCEDURE:  Under adequate general anesthesia, the patient was placed in the dorsal lithotomy position.  Examination under anesthesia revealed a slightly enlarged 10-week size anteverted uterus, irregular.  No adnexal masses could be appreciated.  The patient was positioned for robotic surgery.  She was prepped.  A weighted speculum placed in vagina.  Sims retractor used anteriorly.  The cervix was grasped with a single-tooth tenaculum.  The anterior lip and the posterior lip of the cervix had a figure-of-eight suture placed of 0 Vicryl.  The uterus sounded to 9 cm, 8-mm cannula.  KOH RUMI was subsequently inserted and a medium-sized ring was placed circumferentially around the cervix.  Indwelling Foley catheter was sterilely placed.  The patient was sterilely prepped and draped.  Attention was then turned to the abdomen and small supraumbilical incision was made after 0.25% Marcaine was injected. Veress needle was introduced, 3 liters of CO2 was insufflated after testing.  Veress needle was then removed.  A 12-mm disposable trocar with sleeve was introduced in the abdomen without incident.  The robotic camera was then placed through that port.  Inspection of the pelvis  was notable for irregular fibroid appearing in uterus.  Fitz-Hugh Curtis adhesions subdiaphragmatically.  The rest of the pelvis was unremarkable.  There was omental adhesion to the left fundal aspect of the uterus.  The robotic ports were then placed, 2 on the left and 2 on the right.  The right lower port was the assistant port, which was 12 mm site.  The other 3 sites were 8 mm ports.  They were all placed under direct visualization.  The robot was then brought to the patient's left side with side docking and the ports placed, the robot attached to its robotic instruments. Adjustment was made to the left lower quadrant site.  Once these were satisfactory, a Prograsper, a PK and the monopolar scissors were then all placed.  I then went to the surgical console.  At the console, the pelvis was further inspected.  The patient had omental adhesions to the left fallopian tube and the left posterior fundal region.  The ovary also was encased with adhesions.  The left tube was severely distended consistent with hydrosalpinx and twisted on itself.  The right tube was also had the same problem, was less so, right ovary was buried under the adhesions as was the left ovary.  The right ureter seen peristalsing.  Left was deep in the pelvis.  There were subdiaphragmatic liver adhesions.  Procedure was started with lysis of the adhesions of the omentum off of the tube and the  ovary as well as the fundus of the uterus.  Once this was done, the left utero-ovarian ligament was serially clamped, cauterized and cut.  The round ligament was also serially clamped, cauterized and then cut.  Subsequently, opening up the anterior peritoneum transversely with sharp dissection of the bladder off the lower uterine segment.  The attention was then turned back to the left tube, which was subsequently the mesosalpinx was serially clamped, cut until the tube was removed from its attachment to the ovary.   Adhesions around that ovary was then lysed, bleeding proximally and the ovary was cauterized with good hemostasis.  The uterine vessel on the left was very tortuous, but otherwise visible.  There were skeletonized and subsequently serially clamped, cauterized and cut in several areas, closed with the RUMI KOH ring.  On the opposite side after identifying the ureter, the round ligament was opened, which after cautery and cutting, the bladder was further displaced inferiorly.  The right fallopian tube adhesions were then lysed.  The lysis around the ovary was performed.  The right utero-ovarian ligament which was short was clamped, cauterized and cut.  The tube and ovaries were detached from the uterus.  Once this was done, the uterine vessels were identified. There were skeletonized, serially clamped, cauterized and cut. Attention was then turned back to the right adnexa where the fallopian tube on the right was subsequently severed from its attachment to the ovary and removed as well.  Attention was then turned back to the uterus which was clearly blanching anteriorly.  The bladder has been taken off. The vaginal cuff was then opened anteriorly.  Posteriorly, there was some difficulty getting into it as there was tunneling with subsequently finding of the RUMI KOH ring with the uterus severed from its vaginal attachment.  Bleeding on the posterior vaginal cuff was cauterized.  The specimen was then brought out through the vagina as was the 2 fallopian tubes separately was brought to the vagina.  Once this was done, instruments was switched to.  The bleeders were further recognized posteriorly and cauterized with hemostasis noted.  The vaginal cuff posteriorly, into which figure-of-eight suture x2 was placed to approximate the tunnelling and then the vaginal cuff was then closed with interrupted 0 Vicryl figure-of-eight sutures throughout with good hemostasis noted.  Small bleeding on the  anterior aspect of the bladder was carefully cauterized.  The abdomen was copiously irrigated and suctioned of debris with good hemostasis noted.  The procedure was felt to be complete robotically.  At that point, I removed myself from the console.  The robot was undocked.  The robotic camera remained. The pelvis was then inspected, irrigated, suctioned.  Good hemostasis subsequently noted.  The robotic instruments were then removed under direct visualization.  The abdomen was deflated.  The supraumbilical site was removed.  Instruments in the vagina had been removed.  The 12- mm sites with fascial stitch was closed with 0 Vicryl figure-of-eight suture and the skin approximated with 4-0 Vicryl subcuticular sutures with Dermabond placed on top.  Bimanual examination revealed intact well- approximated vaginal cuff.  Specimen was uterus with both fallopian tubes sent to pathology.  Estimated blood loss was 35 mL. Intraoperative fluid was 2000.  Urine output 260 mL, clear yellow urine. Sponge and instrument counts x2 was correct.  Complication was none. The patient tolerated the procedure well, was transferred to recovery room in stable condition.     April [name], M.D.     /MEDQ  D:  11/30/2010  T:  12/01/2010  Job:  [id number]

## 2011-03-30 ENCOUNTER — Encounter (HOSPITAL_COMMUNITY): Payer: Self-pay | Admitting: Emergency Medicine

## 2011-03-30 ENCOUNTER — Emergency Department (INDEPENDENT_AMBULATORY_CARE_PROVIDER_SITE_OTHER)
Admission: EM | Admit: 2011-03-30 | Discharge: 2011-03-30 | Disposition: A | Discharge status: Home / Self Care | Payer: 59 | Source: Home / Self Care | Attending: Emergency Medicine | Admitting: Emergency Medicine

## 2011-03-30 DIAGNOSIS — J069 Acute upper respiratory infection, unspecified: Secondary | ICD-10-CM

## 2011-03-30 DIAGNOSIS — H9209 Otalgia, unspecified ear: Secondary | ICD-10-CM

## 2011-03-30 MED ORDER — FEXOFENADINE-PSEUDOEPHED ER 60-120 MG PO TB12: 1.0000 | ORAL_TABLET | Freq: Two times a day (BID) | ORAL | Status: AC

## 2011-03-30 MED ORDER — PREDNISONE 20 MG PO TABS: 20.0000 mg | ORAL_TABLET | Freq: Every day | ORAL | Status: AC

## 2011-03-30 NOTE — ED Notes (Signed)
Reports symptoms started last Tuesday, both ears were hurting at first.  Saw pcp after being told she had strep throat and bilateral ear infections--treated with amoxicillin.  Now throat not as bad, ears ar painful, hot/cold flashes/headache

## 2011-03-30 NOTE — Discharge Instructions (Signed)
Otalgia The most common reason for this in children is an infection of the middle ear. Pain from the middle ear is usually caused by a build-up of fluid and pressure behind the eardrum. Pain from an earache can be sharp, dull, or burning. The pain may be temporary or constant. The middle ear is connected to the nasal passages by a short narrow tube called the Eustachian tube. The Eustachian tube allows fluid to drain out of the middle ear, and helps keep the pressure in your ear equalized. CAUSES  A cold or allergy can block the Eustachian tube with inflammation and the build-up of secretions. This is especially likely in small children, because their Eustachian tube is shorter and more horizontal. When the Eustachian tube closes, the normal flow of fluid from the middle ear is stopped. Fluid can accumulate and cause stuffiness, pain, hearing loss, and an ear infection if germs start growing in this area. SYMPTOMS  The symptoms of an ear infection may include fever, ear pain, fussiness, increased crying, and irritability. Many children will have temporary and minor hearing loss during and right after an ear infection. Permanent hearing loss is rare, but the risk increases the more infections a child has. Other causes of ear pain include retained water in the outer ear canal from swimming and bathing. Ear pain in adults is less likely to be from an ear infection. Ear pain may be referred from other locations. Referred pain may be from the joint between your jaw and the skull. It may also come from a tooth problem or problems in the neck. Other causes of ear pain include:  A foreign body in the ear.   Outer ear infection.   Sinus infections.   Impacted ear wax.   Ear injury.   Arthritis of the jaw or TMJ problems.   Middle ear infection.   Tooth infections.   Sore throat with pain to the ears.  DIAGNOSIS  Your caregiver can usually make the diagnosis by examining you. Sometimes other special  studies, including x-rays and lab work may be necessary. TREATMENT   If antibiotics were prescribed, use them as directed and finish them even if you or your child's symptoms seem to be improved.   Sometimes PE tubes are needed in children. These are little plastic tubes which are put into the eardrum during a simple surgical procedure. They allow fluid to drain easier and allow the pressure in the middle ear to equalize. This helps relieve the ear pain caused by pressure changes.  HOME CARE INSTRUCTIONS   Only take over-the-counter or prescription medicines for pain, discomfort, or fever as directed by your caregiver. DO NOT GIVE CHILDREN ASPIRIN because of the association of Reye's Syndrome in children taking aspirin.   Use a cold pack applied to the outer ear for 15 to 20 minutes, 3 to 4 times per day or as needed may reduce pain. Do not apply ice directly to the skin. You may cause frost bite.   Over-the-counter ear drops used as directed may be effective. Your caregiver may sometimes prescribe ear drops.   Resting in an upright position may help reduce pressure in the middle ear and relieve pain.   Ear pain caused by rapidly descending from high altitudes can be relieved by swallowing or chewing gum. Allowing infants to suck on a bottle during airplane travel can help.   Do not smoke in the house or near children. If you are unable to quit smoking, smoke outside.  Control allergies.  SEEK IMMEDIATE MEDICAL CARE IF:   You or your child are becoming sicker.   Pain or fever relief is not obtained with medicine.   You or your child's symptoms (pain, fever, or irritability) do not improve within 24 to 48 hours or as instructed.   Severe pain suddenly stops hurting. This may indicate a ruptured eardrum.   You or your children develop new problems such as severe headaches, stiff neck, difficulty swallowing, or swelling of the face or around the ear.  Document Released: 12/05/2003  Document Revised: 12/30/2010 Document Reviewed: 04/10/2008 [facility] Patient Information 2012 [place], [place].Upper Respiratory Infection, Adult An upper respiratory infection (URI) is also sometimes known as the common cold. The upper respiratory tract includes the nose, sinuses, throat, trachea, and bronchi. Bronchi are the airways leading to the lungs. Most people improve within 1 week, but symptoms can last up to 2 weeks. A residual cough may last even longer.  CAUSES Many different viruses can infect the tissues lining the upper respiratory tract. The tissues become irritated and inflamed and often become very moist. Mucus production is also common. A cold is contagious. You can easily spread the virus to others by oral contact. This includes kissing, sharing a glass, coughing, or sneezing. Touching your mouth or nose and then touching a surface, which is then touched by another person, can also spread the virus. SYMPTOMS  Symptoms typically develop 1 to 3 days after you come in contact with a cold virus. Symptoms vary from person to person. They may include:  Runny nose.   Sneezing.   Nasal congestion.   Sinus irritation.   Sore throat.   Loss of voice (laryngitis).   Cough.   Fatigue.   Muscle aches.   Loss of appetite.   Headache.   Low-grade fever.  DIAGNOSIS  You might diagnose your own cold based on familiar symptoms, since most people get a cold 2 to 3 times a year. Your caregiver can confirm this based on your exam. Most importantly, your caregiver can check that your symptoms are not due to another disease such as strep throat, sinusitis, pneumonia, asthma, or epiglottitis. Blood tests, throat tests, and X-rays are not necessary to diagnose a common cold, but they may sometimes be helpful in excluding other more serious diseases. Your caregiver will decide if any further tests are required. RISKS AND COMPLICATIONS  You may be at risk for a more severe case of the common  cold if you smoke cigarettes, have chronic heart disease (such as heart failure) or lung disease (such as asthma), or if you have a weakened immune system. The very young and very old are also at risk for more serious infections. Bacterial sinusitis, middle ear infections, and bacterial pneumonia can complicate the common cold. The common cold can worsen asthma and chronic obstructive pulmonary disease (COPD). Sometimes, these complications can require emergency medical care and may be life-threatening. PREVENTION  The best way to protect against getting a cold is to practice good hygiene. Avoid oral or hand contact with people with cold symptoms. Wash your hands often if contact occurs. There is no clear evidence that vitamin C, vitamin E, echinacea, or exercise reduces the chance of developing a cold. However, it is always recommended to get plenty of rest and practice good nutrition. TREATMENT  Treatment is directed at relieving symptoms. There is no cure. Antibiotics are not effective, because the infection is caused by a virus, not by bacteria. Treatment may include:  Increased fluid intake. Sports drinks offer valuable electrolytes, sugars, and fluids.   Breathing heated mist or steam (vaporizer or shower).   Eating chicken soup or other clear broths, and maintaining good nutrition.   Getting plenty of rest.   Using gargles or lozenges for comfort.   Controlling fevers with ibuprofen or acetaminophen as directed by your caregiver.   Increasing usage of your inhaler if you have asthma.  Zinc gel and zinc lozenges, taken in the first 24 hours of the common cold, can shorten the duration and lessen the severity of symptoms. Pain medicines may help with fever, muscle aches, and throat pain. A variety of non-prescription medicines are available to treat congestion and runny nose. Your caregiver can make recommendations and may suggest nasal or lung inhalers for other symptoms.  HOME CARE  INSTRUCTIONS   Only take over-the-counter or prescription medicines for pain, discomfort, or fever as directed by your caregiver.   Use a warm mist humidifier or inhale steam from a shower to increase air moisture. This may keep secretions moist and make it easier to breathe.   Drink enough water and fluids to keep your urine clear or pale yellow.   Rest as needed.   Return to work when your temperature has returned to normal or as your caregiver advises. You may need to stay home longer to avoid infecting others. You can also use a face mask and careful hand washing to prevent spread of the virus.  SEEK MEDICAL CARE IF:   After the first few days, you feel you are getting worse rather than better.   You need your caregiver's advice about medicines to control symptoms.   You develop chills, worsening shortness of breath, or brown or red sputum. These may be signs of pneumonia.   You develop yellow or brown nasal discharge or pain in the face, especially when you bend forward. These may be signs of sinusitis.   You develop a fever, swollen neck glands, pain with swallowing, or white areas in the back of your throat. These may be signs of strep throat.  SEEK IMMEDIATE MEDICAL CARE IF:   You have a fever.   You develop severe or persistent headache, ear pain, sinus pain, or chest pain.   You develop wheezing, a prolonged cough, cough up blood, or have a change in your usual mucus (if you have chronic lung disease).   You develop sore muscles or a stiff neck.  Document Released: 10/13/2000 Document Revised: 12/30/2010 Document Reviewed: 08/21/2010 [facility] Patient Information 2012 [place], [place].

## 2011-03-30 NOTE — ED Provider Notes (Addendum)
History     CSN: [id number] Arrival date & time: 03/30/2011  1:57 PM   First MD Initiated Contact with Patient 03/30/11 1313      Chief Complaint  Patient presents with  . URI    (Consider location/radiation/quality/duration/timing/severity/associated sxs/prior treatment) HPI Comments: SEE BY MY DOCTOR FOR BRONCHITIS AND A STREP TEST" STILL TAKING THE AMOXICILLIN BUT BOTH MY EARS ARE HURTING AND THE COUGH HAS PHLEGM IS NOT GOING AWAY"  Patient is a 40 y.o. female presenting with URI. The history is provided by the patient.  URI The primary symptoms include fever, headaches, ear pain, sore throat and cough. Primary symptoms do not include wheezing, abdominal pain, arthralgias or rash. The current episode started more than 1 week ago. This is a new problem.  Symptoms associated with the illness include plugged ear sensation, congestion and rhinorrhea. The illness is not associated with sinus pressure.    History reviewed. No pertinent past medical history.  Past Surgical History  Procedure Date  . No past surgeries   . Abdominal hysterectomy     History reviewed. No pertinent family history.  History  Substance Use Topics  . Smoking status: Current Everyday Smoker    Types: Cigarettes  . Smokeless tobacco: Never Used  . Alcohol Use: Yes    OB History    Grav Para Term Preterm Abortions TAB SAB Ect Mult Living                  Review of Systems  Constitutional: Positive for fever.  HENT: Positive for ear pain, congestion, sore throat and rhinorrhea. Negative for sinus pressure.   Respiratory: Positive for cough. Negative for wheezing.   Gastrointestinal: Negative for abdominal pain.  Musculoskeletal: Negative for arthralgias.  Skin: Negative for rash.  Neurological: Positive for headaches.    Allergies  Review of patient's allergies indicates no known allergies.  Home Medications   Current Outpatient Rx  Name Route Sig Dispense Refill  . AMOXICILLIN PO  Oral Take by mouth.      Marland Kitchen FEXOFENADINE-PSEUDOEPHEDRINE 60-120 MG PO TB12 Oral Take 1 tablet by mouth every 12 (twelve) hours. 30 tablet 0  . PREDNISONE 20 MG PO TABS Oral Take 1 tablet (20 mg total) by mouth daily. 10 tablet 0    BP 131/87  Pulse 66  Temp(Src) 98.7 F (37.1 C) (Oral)  Resp 20  SpO2 100%  LMP 11/27/2010  Physical Exam  Nursing note and vitals reviewed. Constitutional: She appears well-developed and well-nourished.  HENT:  Head: Normocephalic.  Right Ear: Tympanic membrane normal.  Left Ear: Tympanic membrane normal.  Nose: Mucosal edema and rhinorrhea present.  Mouth/Throat: Uvula is midline, oropharynx is clear and moist and mucous membranes are normal.  Eyes: Pupils are equal, round, and reactive to light.  Neck: Normal range of motion.  Pulmonary/Chest: Effort normal and breath sounds normal.  Musculoskeletal: Normal range of motion.    ED Course  Procedures (including critical care time)  Labs Reviewed - No data to display No results found.   1. URI, acute   2. Otalgia       MDM  RESIDUAL COUGH AND B/L OTALGIA- POST ANTIBIOTICS-        Jimmie Molly, MD 03/30/11 1754  Jimmie Molly, MD 03/30/11 1755

## 2012-08-21 ENCOUNTER — Emergency Department (HOSPITAL_BASED_OUTPATIENT_CLINIC_OR_DEPARTMENT_OTHER)
Admission: EM | Admit: 2012-08-21 | Discharge: 2012-08-21 | Disposition: A | Discharge status: Home / Self Care | Payer: BC Managed Care – PPO | Attending: Emergency Medicine | Admitting: Emergency Medicine

## 2012-08-21 ENCOUNTER — Emergency Department (HOSPITAL_BASED_OUTPATIENT_CLINIC_OR_DEPARTMENT_OTHER): Payer: BC Managed Care – PPO

## 2012-08-21 ENCOUNTER — Encounter (HOSPITAL_BASED_OUTPATIENT_CLINIC_OR_DEPARTMENT_OTHER): Payer: Self-pay | Admitting: *Deleted

## 2012-08-21 DIAGNOSIS — F17200 Nicotine dependence, unspecified, uncomplicated: Secondary | ICD-10-CM | POA: Insufficient documentation

## 2012-08-21 DIAGNOSIS — Z3202 Encounter for pregnancy test, result negative: Secondary | ICD-10-CM | POA: Insufficient documentation

## 2012-08-21 DIAGNOSIS — Z90710 Acquired absence of both cervix and uterus: Secondary | ICD-10-CM | POA: Insufficient documentation

## 2012-08-21 DIAGNOSIS — N83209 Unspecified ovarian cyst, unspecified side: Secondary | ICD-10-CM | POA: Insufficient documentation

## 2012-08-21 DIAGNOSIS — N390 Urinary tract infection, site not specified: Secondary | ICD-10-CM

## 2012-08-21 HISTORY — DX: Type 2 diabetes mellitus without complications: E11.9

## 2012-08-21 LAB — COMPREHENSIVE METABOLIC PANEL
AST: 24 U/L (ref 0–37)
CO2: 27 mEq/L (ref 19–32)
Calcium: 9.6 mg/dL (ref 8.4–10.5)
Creatinine, Ser: 0.8 mg/dL (ref 0.50–1.10)
GFR calc Af Amer: >90 mL/min (ref >90)
GFR calc non Af Amer: >90 mL/min (ref >90)
Total Protein: 7.7 g/dL (ref 6.0–8.3)

## 2012-08-21 LAB — URINALYSIS, ROUTINE W REFLEX MICROSCOPIC
Bilirubin Urine: NEGATIVE
Hgb urine dipstick: NEGATIVE
Ketones, ur: NEGATIVE mg/dL
Nitrite: NEGATIVE
Protein, ur: NEGATIVE mg/dL
Urobilinogen, UA: 0.2 mg/dL (ref 0.0–1.0)

## 2012-08-21 LAB — CBC WITH DIFFERENTIAL/PLATELET
Basophils Absolute: 0.1 10*3/uL (ref 0.0–0.1)
Eosinophils Absolute: 0.5 10*3/uL (ref 0.0–0.7)
Eosinophils Relative: 6 % — ABNORMAL HIGH (ref 0–5)
HCT: 43.5 % (ref 36.0–46.0)
Lymphocytes Relative: 29 % (ref 12–46)
MCH: 27.4 pg (ref 26.0–34.0)
MCHC: 33.8 g/dL (ref 30.0–36.0)
MCV: 81 fL (ref 78.0–100.0)
Monocytes Absolute: 0.8 10*3/uL (ref 0.1–1.0)
Platelets: 233 10*3/uL (ref 150–400)
RDW: 14.1 % (ref 11.5–15.5)
WBC: 8.1 10*3/uL (ref 4.0–10.5)

## 2012-08-21 LAB — WET PREP, GENITAL
Clue Cells Wet Prep HPF POC: NONE SEEN
Trich, Wet Prep: NONE SEEN

## 2012-08-21 LAB — URINE MICROSCOPIC-ADD ON

## 2012-08-21 LAB — LACTIC ACID, PLASMA: Lactic Acid, Venous: 1.2 mmol/L (ref 0.5–2.2)

## 2012-08-21 MED ORDER — ONDANSETRON HCL 4 MG/2ML IJ SOLN
4.0000 mg | Freq: Once | INTRAMUSCULAR | Status: AC
  Administered 2012-08-21: 4 mg via INTRAVENOUS
  Filled 2012-08-21: qty 2

## 2012-08-21 MED ORDER — IOHEXOL 300 MG/ML  SOLN
50.0000 mL | Freq: Once | INTRAMUSCULAR | Status: AC | PRN
  Administered 2012-08-21: 50 mL via ORAL

## 2012-08-21 MED ORDER — CEPHALEXIN 500 MG PO CAPS: 500.0000 mg | ORAL_CAPSULE | Freq: Four times a day (QID) | ORAL | Status: DC

## 2012-08-21 MED ORDER — MORPHINE SULFATE 4 MG/ML IJ SOLN
4.0000 mg | Freq: Once | INTRAMUSCULAR | Status: AC
  Administered 2012-08-21: 4 mg via INTRAVENOUS
  Filled 2012-08-21: qty 1

## 2012-08-21 MED ORDER — HYDROCODONE-ACETAMINOPHEN 5-325 MG PO TABS: 2.0000 | ORAL_TABLET | ORAL | Status: DC | PRN

## 2012-08-21 MED ORDER — ONDANSETRON HCL 4 MG PO TABS: 4.0000 mg | ORAL_TABLET | Freq: Four times a day (QID) | ORAL | Status: DC

## 2012-08-21 MED ORDER — HYDROCODONE-ACETAMINOPHEN 5-325 MG PO TABS
2.0000 | ORAL_TABLET | Freq: Once | ORAL | Status: AC
  Administered 2012-08-21: 2 via ORAL
  Filled 2012-08-21: qty 2

## 2012-08-21 MED ORDER — IOHEXOL 300 MG/ML  SOLN
100.0000 mL | Freq: Once | INTRAMUSCULAR | Status: AC | PRN
Start: 2012-08-21 — End: 2012-08-21
  Administered 2012-08-21: 100 mL via INTRAVENOUS

## 2012-08-21 MED ORDER — SODIUM CHLORIDE 0.9 % IV SOLN
Freq: Once | INTRAVENOUS | Status: AC
  Administered 2012-08-21: 20:00:00 via INTRAVENOUS

## 2012-08-21 NOTE — ED Provider Notes (Signed)
History    This chart was scribed for Glynn Octave, MD by Leone Payor, ED Scribe. This patient was seen in room MH03/MH03 and the patient's care was started 6:55 PM.   CSN: [id number]  Arrival date & time 08/21/12  1641   First MD Initiated Contact with Patient 08/21/12 1853      Chief Complaint  Patient presents with  . Abdominal Pain     The history is provided by the patient. No language interpreter was used.    April [name] is a 42 y.o. female who presents to the Emergency Department complaining of ongoing, intermittent, unchanged LLQ starting 3 days ago that became constant starting yesterday. Pt describes the pain as sharp and nagging. Reports BMs are normal. Denies nausea, vomiting, diarrhea, fever, dysuria, hematuria, vaginal bleeding, vaginal discharge. Pt has h/o DM and hysterectomy.  Pt is a current everyday smoker and occasional alcohol user.  History reviewed. No pertinent past medical history.  Past Surgical History  Procedure Laterality Date  . No past surgeries    . Abdominal hysterectomy      History reviewed. No pertinent family history.  History  Substance Use Topics  . Smoking status: Current Every Day Smoker    Types: Cigarettes  . Smokeless tobacco: Never Used  . Alcohol Use: Yes    No OB history provided.   Review of Systems A complete 10 system review of systems was obtained and all systems are negative except as noted in the HPI and PMH.   Allergies  Review of patient's allergies indicates no known allergies.  Home Medications   Current Outpatient Rx  Name  Route  Sig  Dispense  Refill  . Vitamin D, Ergocalciferol, (DRISDOL) 50000 UNITS CAPS   Oral   Take 50,000 Units by mouth every 7 (seven) days.         . AMOXICILLIN PO   Oral   Take by mouth.             BP 134/66  Pulse 74  Temp(Src) 98.4 F (36.9 C) (Oral)  Resp 18  Ht 5\' 5"  (1.651 m)  Wt 253 lb (114.76 kg)  BMI 42.1 kg/m2  SpO2 97%  LMP  11/27/2010  Physical Exam  Nursing note and vitals reviewed. Constitutional: She is oriented to person, place, and time. She appears well-developed and well-nourished. No distress.  HENT:  Head: Normocephalic and atraumatic.  Eyes: EOM are normal.  Neck: Neck supple. No tracheal deviation present.  Cardiovascular: Normal rate, regular rhythm and normal heart sounds.   Pulmonary/Chest: Effort normal and breath sounds normal. No respiratory distress. She has no wheezes. She has no rales. She exhibits no tenderness.  Abdominal: Soft. There is tenderness. There is no rebound and no guarding.  Mild LLQ pain.   Genitourinary:  No CVA tenderness.   Cervix is absent. Left adnexal tenderness with mass. No right adnexal tenderness.   Musculoskeletal: Normal range of motion.  Neurological: She is alert and oriented to person, place, and time.  Skin: Skin is warm and dry.  Psychiatric: She has a normal mood and affect. Her behavior is normal.    ED Course  Procedures (including critical care time)  DIAGNOSTIC STUDIES: Oxygen Saturation is 97% on room air, adequate by my interpretation.    COORDINATION OF CARE: 7:19 PM-Discussed treatment plan with pt at bedside and pt agreed to plan.    Labs Reviewed  URINALYSIS, ROUTINE W REFLEX MICROSCOPIC - Abnormal; Notable for the following:  APPearance CLOUDY (*)    Leukocytes, UA LARGE (*)    All other components within normal limits  CBC WITH DIFFERENTIAL - Abnormal; Notable for the following:    RBC 5.37 (*)    Eosinophils Relative 6 (*)    All other components within normal limits  COMPREHENSIVE METABOLIC PANEL - Abnormal; Notable for the following:    Total Bilirubin 0.1 (*)    All other components within normal limits  URINE MICROSCOPIC-ADD ON - Abnormal; Notable for the following:    Squamous Epithelial / LPF FEW (*)    Bacteria, UA FEW (*)    All other components within normal limits  URINE CULTURE  WET PREP, GENITAL   GC/CHLAMYDIA PROBE AMP  PREGNANCY, URINE  LIPASE, BLOOD  LACTIC ACID, PLASMA   US Transvaginal Non-ob  08/21/2012  *RADIOLOGY REPORT*  Clinical Data: Left adnexal pain  TRANSABDOMINAL AND TRANSVAGINAL ULTRASOUND OF PELVIS Technique:  Both transabdominal and transvaginal ultrasound examinations of the pelvis were performed. Transabdominal technique was performed for global imaging of the pelvis including uterus, ovaries, adnexal regions, and pelvic cul-de-sac.  It was necessary to proceed with endovaginal exam following the transabdominal exam to visualize the endometrium and adnexa.  Comparison:  08/21/2012 CT  Findings:  Uterus: Absent  Endometrium: Not applicable  Right ovary:  Not visualized due to overlying bowel  Left ovary: Measures 5.2 x 5.0 x 5.0 cm. Color Doppler flow with arterial and venous wave forms documented left ovary.4.3 x 4.2 x 3.8 cm anechoic cyst with thin internal septations.  Other findings: No free fluid  IMPRESSION: Status post hysterectomy.  . Color Doppler flow with arterial and venous wave forms documented to the left ovary.  There is a mildly complex 4.3 cm anechoic cyst with several thin internal septations. This suggests neoplasm however is favored to be benign. Consider surgical evaluation.   Original Report Authenticated By: Jearld Lesch, M.D.    US Pelvis Complete  08/21/2012  *RADIOLOGY REPORT*  Clinical Data: Left adnexal pain  TRANSABDOMINAL AND TRANSVAGINAL ULTRASOUND OF PELVIS Technique:  Both transabdominal and transvaginal ultrasound examinations of the pelvis were performed. Transabdominal technique was performed for global imaging of the pelvis including uterus, ovaries, adnexal regions, and pelvic cul-de-sac.  It was necessary to proceed with endovaginal exam following the transabdominal exam to visualize the endometrium and adnexa.  Comparison:  08/21/2012 CT  Findings:  Uterus: Absent  Endometrium: Not applicable  Right ovary:  Not visualized due to  overlying bowel  Left ovary: Measures 5.2 x 5.0 x 5.0 cm. Color Doppler flow with arterial and venous wave forms documented left ovary.4.3 x 4.2 x 3.8 cm anechoic cyst with thin internal septations.  Other findings: No free fluid  IMPRESSION: Status post hysterectomy.  . Color Doppler flow with arterial and venous wave forms documented to the left ovary.  There is a mildly complex 4.3 cm anechoic cyst with several thin internal septations. This suggests neoplasm however is favored to be benign. Consider surgical evaluation.   Original Report Authenticated By: Jearld Lesch, M.D.    Ct Abdomen Pelvis W Contrast  08/21/2012  *RADIOLOGY REPORT*  Clinical Data: Abdominal pain.  Diabetes.  CT ABDOMEN AND PELVIS WITH CONTRAST  Technique:  Multidetector CT imaging of the abdomen and pelvis was performed following the standard protocol during bolus administration of intravenous contrast.  Contrast: 50mL OMNIPAQUE IOHEXOL 300 MG/ML  SOLN, OMNIPAQUE IOHEXOL 300 MG/ML  SOLN  Comparison: None.  Findings: Lung bases are clear.  Heart size is normal.  The liver and spleen are normal.  Pancreas and kidneys are normal. No renal mass or obstruction.  Negative for bowel obstruction or bowel thickening.  Appendix is normal.  Cyst in the left adnexa measures 4 x 4.7 cm.  No free fluid in the pelvis.  No acute bony abnormality.  IMPRESSION: 4 x 4.7 cm left adnexal cyst which appears to be ovarian in nature. This is most likely benign.  No free fluid.   Original Report Authenticated By: Janeece Riggers, M.D.    [place] Art/ven Flow Abd Pelv Doppler  08/21/2012  *RADIOLOGY REPORT*  Clinical Data: Left adnexal pain  TRANSABDOMINAL AND TRANSVAGINAL ULTRASOUND OF PELVIS Technique:  Both transabdominal and transvaginal ultrasound examinations of the pelvis were performed. Transabdominal technique was performed for global imaging of the pelvis including uterus, ovaries, adnexal regions, and pelvic cul-de-sac.  It was necessary to proceed  with endovaginal exam following the transabdominal exam to visualize the endometrium and adnexa.  Comparison:  08/21/2012 CT  Findings:  Uterus: Absent  Endometrium: Not applicable  Right ovary:  Not visualized due to overlying bowel  Left ovary: Measures 5.2 x 5.0 x 5.0 cm. Color Doppler flow with arterial and venous wave forms documented left ovary.4.3 x 4.2 x 3.8 cm anechoic cyst with thin internal septations.  Other findings: No free fluid  IMPRESSION: Status post hysterectomy.  . Color Doppler flow with arterial and venous wave forms documented to the left ovary.  There is a mildly complex 4.3 cm anechoic cyst with several thin internal septations. This suggests neoplasm however is favored to be benign. Consider surgical evaluation.   Original Report Authenticated By: Jearld Lesch, M.D.      No diagnosis found.    MDM  Intermittent left lower quadrant pain for the past 3 days but became constant today. No nausea, vomiting, fever or diarrhea. No urinary symptoms.  Appears nontoxic. Left lower quadrant pain to palpation. No guarding.  UA positive for white blood cells. Will treat UTI and send culture.  Ovarian cyst seen on CT scan it appears to be quite large. This is likely source of patient's pain. Doppler shows normal blood flow.  Cannot rule out malignancy. Patient has a gynecologist Dr. cousins who she will call first thing in the morning. She understands that this may be a benign cyst though neoplasm cannot be ruled out.  I personally performed the services described in this documentation, which was scribed in my presence. The recorded information has been reviewed and is accurate.   Glynn Octave, MD 08/21/12 [phone]

## 2012-08-21 NOTE — ED Notes (Signed)
Pt requested pain med and RTW note prior to d/c-EDP notified-orders received

## 2012-08-21 NOTE — ED Notes (Signed)
Pt amb to triage with quick steady gait in nad. Pt reports llq pain x Friday, constant nagging pain sharp in nature. Denies any n/v/d/fevers, last bm yesterday.

## 2012-08-22 ENCOUNTER — Encounter: Payer: Self-pay | Admitting: Obstetrics & Gynecology

## 2012-08-22 LAB — URINE CULTURE

## 2012-10-25 ENCOUNTER — Ambulatory Visit: Payer: Self-pay | Admitting: Obstetrics & Gynecology

## 2013-08-15 ENCOUNTER — Encounter (HOSPITAL_COMMUNITY): Payer: Self-pay | Admitting: Emergency Medicine

## 2013-08-15 ENCOUNTER — Emergency Department (HOSPITAL_COMMUNITY)
Admission: EM | Admit: 2013-08-15 | Discharge: 2013-08-15 | Disposition: A | Discharge status: Home / Self Care | Payer: BC Managed Care – PPO | Attending: Emergency Medicine | Admitting: Emergency Medicine

## 2013-08-15 ENCOUNTER — Emergency Department (HOSPITAL_COMMUNITY): Payer: BC Managed Care – PPO

## 2013-08-15 DIAGNOSIS — E119 Type 2 diabetes mellitus without complications: Secondary | ICD-10-CM | POA: Insufficient documentation

## 2013-08-15 DIAGNOSIS — R609 Edema, unspecified: Secondary | ICD-10-CM | POA: Insufficient documentation

## 2013-08-15 DIAGNOSIS — R51 Headache: Secondary | ICD-10-CM | POA: Insufficient documentation

## 2013-08-15 DIAGNOSIS — F17200 Nicotine dependence, unspecified, uncomplicated: Secondary | ICD-10-CM | POA: Insufficient documentation

## 2013-08-15 DIAGNOSIS — R519 Headache, unspecified: Secondary | ICD-10-CM

## 2013-08-15 DIAGNOSIS — R600 Localized edema: Secondary | ICD-10-CM

## 2013-08-15 LAB — BASIC METABOLIC PANEL
BUN: 10 mg/dL (ref 6–23)
CHLORIDE: 104 meq/L (ref 96–112)
CO2: 25 mEq/L (ref 19–32)
CREATININE: 0.87 mg/dL (ref 0.50–1.10)
Calcium: 9.1 mg/dL (ref 8.4–10.5)
GFR calc non Af Amer: 82 mL/min — ABNORMAL LOW (ref >90)
Glucose, Bld: 99 mg/dL (ref 70–99)
Potassium: 4.4 mEq/L (ref 3.7–5.3)
Sodium: 139 mEq/L (ref 137–147)

## 2013-08-15 LAB — URINALYSIS, ROUTINE W REFLEX MICROSCOPIC
BILIRUBIN URINE: NEGATIVE
Glucose, UA: NEGATIVE mg/dL
HGB URINE DIPSTICK: NEGATIVE
KETONES UR: NEGATIVE mg/dL
Leukocytes, UA: NEGATIVE
NITRITE: NEGATIVE
PH: 7.5 (ref 5.0–8.0)
Protein, ur: NEGATIVE mg/dL
Specific Gravity, Urine: 1.02 (ref 1.005–1.030)
Urobilinogen, UA: 1 mg/dL (ref 0.0–1.0)

## 2013-08-15 LAB — CBC
HEMATOCRIT: 42.3 % (ref 36.0–46.0)
Hemoglobin: 14.4 g/dL (ref 12.0–15.0)
MCH: 28.1 pg (ref 26.0–34.0)
MCHC: 34 g/dL (ref 30.0–36.0)
MCV: 82.5 fL (ref 78.0–100.0)
Platelets: 213 10*3/uL (ref 150–400)
RBC: 5.13 MIL/uL — ABNORMAL HIGH (ref 3.87–5.11)
RDW: 14.7 % (ref 11.5–15.5)
WBC: 7.1 10*3/uL (ref 4.0–10.5)

## 2013-08-15 LAB — PRO B NATRIURETIC PEPTIDE: Pro B Natriuretic peptide (BNP): 159.1 pg/mL — ABNORMAL HIGH (ref 0–125)

## 2013-08-15 MED ORDER — DIPHENHYDRAMINE HCL 50 MG/ML IJ SOLN
25.0000 mg | Freq: Once | INTRAMUSCULAR | Status: AC
  Administered 2013-08-15: 25 mg via INTRAVENOUS
  Filled 2013-08-15: qty 1

## 2013-08-15 MED ORDER — SODIUM CHLORIDE 0.9 % IV BOLUS (SEPSIS)
1000.0000 mL | Freq: Once | INTRAVENOUS | Status: AC
  Administered 2013-08-15: 1000 mL via INTRAVENOUS

## 2013-08-15 MED ORDER — METOCLOPRAMIDE HCL 5 MG/ML IJ SOLN
10.0000 mg | Freq: Once | INTRAMUSCULAR | Status: AC
  Administered 2013-08-15: 10 mg via INTRAVENOUS
  Filled 2013-08-15: qty 2

## 2013-08-15 MED ORDER — KETOROLAC TROMETHAMINE 30 MG/ML IJ SOLN
30.0000 mg | Freq: Once | INTRAMUSCULAR | Status: AC
  Administered 2013-08-15: 30 mg via INTRAVENOUS
  Filled 2013-08-15: qty 1

## 2013-08-15 NOTE — Discharge Instructions (Signed)
Return to the ED with any concerns including chest pain, difficulty breathing, fainting, weakness in arms or legs, changes in vision or speech, decreased level of alertness/lethargy, or any other alarming symptoms

## 2013-08-15 NOTE — ED Notes (Signed)
Pt states that she has had headache since yesterday.  States that both of her legs are swollen x 3 days.  Was told that her doctor had no appts available.  Denies vomiting or diarrhea.

## 2013-08-15 NOTE — ED Notes (Signed)
Pt. Is not able to use the restroom at this time, but is aware that we need a urine specimen. 

## 2013-08-15 NOTE — ED Notes (Signed)
Pt to xray

## 2013-08-15 NOTE — ED Provider Notes (Signed)
CSN: [id number]     Arrival date & time 08/15/13  1100 History   First MD Initiated Contact with Patient 08/15/13 1150     Chief Complaint  Patient presents with  . Headache  . Leg Swelling     (Consider location/radiation/quality/duration/timing/severity/associated sxs/prior Treatment) HPI Pt presents with c/o lower extremity swelling. She states both of her legs have been swollen over the past few days.  States she has had this in the past but today seems worse.  Also c/o gradual onset of headache over the past several days.  HA is diffuse and changes sides, also feels tension in her neck.  No vision changes, no changes in speech, no focal weakness or numbness.  No chest pain or shortness of breath.  Headache is constant and throbbing in nature.  No fever/chills.  There are no other associated systemic symptoms, there are no other alleviating or modifying factors.   Past Medical History  Diagnosis Date  . Diabetes mellitus without complication    Past Surgical History  Procedure Laterality Date  . No past surgeries    . Abdominal hysterectomy     No family history on file. History  Substance Use Topics  . Smoking status: Current Every Day Smoker    Types: Cigarettes  . Smokeless tobacco: Never Used  . Alcohol Use: Yes   OB History   Grav Para Term Preterm Abortions TAB SAB Ect Mult Living                 Review of Systems ROS reviewed and all otherwise negative except for mentioned in HPI    Allergies  Review of patient's allergies indicates no known allergies.  Home Medications   Prior to Admission medications   Not on File   BP 135/73  Pulse 74  Temp(Src) 97.9 F (36.6 C) (Oral)  Resp 16  SpO2 99%  LMP 11/27/2010 Vitals reviewed Physical Exam Physical Examination: General appearance - alert, well appearing, and in no distress Mental status - alert, oriented to person, place, and time Eyes - no conjunctival injection, no scleral icterus Mouth - mucous  membranes moist, pharynx normal without lesions Neck - supple, no significant adenopathy Chest - clear to auscultation, no wheezes, rales or rhonchi, symmetric air entry Heart - normal rate, regular rhythm, normal S1, S2, no murmurs, rubs, clicks or gallops Abdomen - soft, nontender, nondistended, no masses or organomegaly Neurological - alert, oriented x 3, cranial nerves 2-12 tested and intact, strength 5/5 in extremities x 4, sensation intact Extremities - peripheral pulses normal, no pedal edema, no clubbing or cyanosis Skin - normal coloration and turgor, no rashes  ED Course  Procedures (including critical care time) Labs Review Labs Reviewed  CBC - Abnormal; Notable for the following:    RBC 5.13 (*)    All other components within normal limits  BASIC METABOLIC PANEL - Abnormal; Notable for the following:    GFR calc non Af Amer 82 (*)    All other components within normal limits  PRO B NATRIURETIC PEPTIDE - Abnormal; Notable for the following:    Pro B Natriuretic peptide (BNP) 159.1 (*)    All other components within normal limits  URINALYSIS, ROUTINE W REFLEX MICROSCOPIC    Imaging Review Dg Chest 2 View  08/15/2013   CLINICAL DATA:  Cough and bilateral lower extremity edema.  EXAM: CHEST - 2 VIEW  COMPARISON:  DG CHEST 1V PORT dated 08/28/2010  FINDINGS: The heart size and mediastinal contours are  within normal limits. There is no evidence of pulmonary edema, consolidation, pneumothorax, nodule or pleural fluid. The visualized skeletal structures are unremarkable.  IMPRESSION: No active disease.   Electronically Signed   By: Irish LackGlenn  Yamagata M.D.   On: 08/15/2013 13:38     EKG Interpretation   Date/Time:  Wednesday August 15 2013 15:14:24 EDT Ventricular Rate:  61 PR Interval:  160 QRS Duration: 86 QT Interval:  426 QTC Calculation: 429 R Axis:   26 Text Interpretation:  Sinus rhythm No significant change since last  tracing Confirmed by [facility]  MD, MARTHA [phone]([id number]) on  08/15/2013 3:18:05 PM      MDM   Final diagnoses:  Headache  Lower extremity edema    Pt presents with headache- gradual in onset, normal neuro exam- doubt SAH, CVA or other acute emergent pathology at this time. Pt with very mild bilateral lower extremity swelling.  Doubt DVT as this is symmetric and no risk factors for DVT/PE.  Renal function normal.  Ekg reassuring. As well as CXR.  Pt feels improvement in headache after migraine cocktail.  Discharged with strict return precautions.  Pt agreeable with plan.    Ethelda ChickMartha K Linker, MD 08/16/13 1800

## 2013-12-25 ENCOUNTER — Emergency Department (HOSPITAL_COMMUNITY): Payer: BC Managed Care – PPO

## 2013-12-25 ENCOUNTER — Emergency Department (HOSPITAL_COMMUNITY)
Admission: EM | Admit: 2013-12-25 | Discharge: 2013-12-25 | Disposition: A | Discharge status: Home / Self Care | Payer: BC Managed Care – PPO | Attending: Emergency Medicine | Admitting: Emergency Medicine

## 2013-12-25 ENCOUNTER — Encounter (HOSPITAL_COMMUNITY): Payer: Self-pay | Admitting: Emergency Medicine

## 2013-12-25 DIAGNOSIS — E119 Type 2 diabetes mellitus without complications: Secondary | ICD-10-CM | POA: Insufficient documentation

## 2013-12-25 DIAGNOSIS — J40 Bronchitis, not specified as acute or chronic: Secondary | ICD-10-CM | POA: Diagnosis not present

## 2013-12-25 DIAGNOSIS — R062 Wheezing: Secondary | ICD-10-CM | POA: Diagnosis not present

## 2013-12-25 DIAGNOSIS — F17200 Nicotine dependence, unspecified, uncomplicated: Secondary | ICD-10-CM | POA: Diagnosis not present

## 2013-12-25 DIAGNOSIS — R0602 Shortness of breath: Secondary | ICD-10-CM | POA: Insufficient documentation

## 2013-12-25 DIAGNOSIS — J040 Acute laryngitis: Secondary | ICD-10-CM | POA: Insufficient documentation

## 2013-12-25 MED ORDER — PREDNISONE 20 MG PO TABS
60.0000 mg | ORAL_TABLET | Freq: Once | ORAL | Status: AC
  Administered 2013-12-25: 60 mg via ORAL
  Filled 2013-12-25: qty 3

## 2013-12-25 MED ORDER — ALBUTEROL SULFATE HFA 108 (90 BASE) MCG/ACT IN AERS: 2.0000 | INHALATION_SPRAY | RESPIRATORY_TRACT | Status: DC | PRN

## 2013-12-25 MED ORDER — ALBUTEROL SULFATE (2.5 MG/3ML) 0.083% IN NEBU
5.0000 mg | INHALATION_SOLUTION | Freq: Once | RESPIRATORY_TRACT | Status: AC
  Administered 2013-12-25: 5 mg via RESPIRATORY_TRACT
  Filled 2013-12-25: qty 6

## 2013-12-25 MED ORDER — IPRATROPIUM BROMIDE 0.02 % IN SOLN
0.5000 mg | Freq: Once | RESPIRATORY_TRACT | Status: AC
  Administered 2013-12-25: 0.5 mg via RESPIRATORY_TRACT
  Filled 2013-12-25: qty 2.5

## 2013-12-25 MED ORDER — AEROCHAMBER PLUS W/MASK MISC: Status: DC

## 2013-12-25 MED ORDER — GUAIFENESIN 100 MG/5ML PO SYRP: 200.0000 mg | ORAL_SOLUTION | ORAL | Status: DC | PRN

## 2013-12-25 MED ORDER — PREDNISONE 20 MG PO TABS: ORAL_TABLET | ORAL | Status: DC

## 2013-12-25 MED ORDER — IPRATROPIUM BROMIDE 0.02 % IN SOLN
0.5000 mg | Freq: Once | RESPIRATORY_TRACT | Status: AC
Start: 2013-12-25 — End: 2013-12-25
  Administered 2013-12-25: 0.5 mg via RESPIRATORY_TRACT
  Filled 2013-12-25: qty 2.5

## 2013-12-25 NOTE — ED Notes (Signed)
Pt ambulated in hallway pulse ox 97%

## 2013-12-25 NOTE — ED Provider Notes (Signed)
Medical screening examination/treatment/procedure(s) were performed by non-physician practitioner and as supervising physician I was immediately available for consultation/collaboration.   EKG Interpretation None        Gilda Crease, MD 12/25/13 [phone]

## 2013-12-25 NOTE — Discharge Instructions (Signed)
Continue to stay well-hydrated. Gargle warm salt water and spit it out. Continue to alternate between Tylenol and Ibuprofen for pain or fever. May consider over-the-counter Benadryl for additional relief. Followup with your primary care doctor in 5-7 days for recheck of ongoing symptoms. If you don't have a primary doctor, use the resource guide below to find one. Return to emergency department for emergent changing or worsening of symptoms.  Take benadryl or other antihistamine to decrease secretions and for watery itchy eyes.   Use inhaler as directed, as needed for cough/chest congestion.  Use Mucinex for cough suppression/expectoration of mucus. Use prednisone as directed for ongoing improvement of your hoarse voice and airway issues.   Cough, Adult  A cough is a reflex. It helps you clear your throat and airways. A cough can help heal your body. A cough can last 2 or 3 weeks (acute) or may last more than 8 weeks (chronic). Some common causes of a cough can include an infection, allergy, or a cold. HOME CARE  Only take medicine as told by your doctor.  If given, take your medicines (antibiotics) as told. Finish them even if you start to feel better.  Use a cold steam vaporizer or humidifier in your home. This can help loosen thick spit (secretions).  Sleep so you are almost sitting up (semi-upright). Use pillows to do this. This helps reduce coughing.  Rest as needed.  Stop smoking if you smoke. GET HELP RIGHT AWAY IF:  You have yellowish-white fluid (pus) in your thick spit.  Your cough gets worse.  Your medicine does not reduce coughing, and you are losing sleep.  You cough up blood.  You have trouble breathing.  Your pain gets worse and medicine does not help.  You have a fever. MAKE SURE YOU:   Understand these instructions.  Will watch your condition.  Will get help right away if you are not doing well or get worse. Document Released: 12/31/2010 Document  Revised: 09/03/2013 Document Reviewed: 12/31/2010 [facility] Patient Information 2015 [place], [place]. This information is not intended to replace advice given to you by your health care provider. Make sure you discuss any questions you have with your health care provider.  How to Use an Inhaler Using your inhaler correctly is very important. Good technique will make sure that the medicine reaches your lungs.  HOW TO USE AN INHALER: 1. Take the cap off the inhaler. 2. If this is the first time using your inhaler, you need to prime it. Shake the inhaler for 5 seconds. Release four puffs into the air, away from your face. Ask your doctor for help if you have questions. 3. Shake the inhaler for 5 seconds. 4. Turn the inhaler so the bottle is above the mouthpiece. 5. Put your pointer finger on top of the bottle. Your thumb holds the bottom of the inhaler. 6. Open your mouth. 7. Either hold the inhaler away from your mouth (the width of 2 fingers) or place your lips tightly around the mouthpiece. Ask your doctor which way to use your inhaler. 8. Breathe out as much air as possible. 9. Breathe in and push down on the bottle 1 time to release the medicine. You will feel the medicine go in your mouth and throat. 10. Continue to take a deep breath in very slowly. Try to fill your lungs. 11. After you have breathed in completely, hold your breath for 10 seconds. This will help the medicine to settle in your lungs. If you  cannot hold your breath for 10 seconds, hold it for as long as you can before you breathe out. 12. Breathe out slowly, through pursed lips. Whistling is an example of pursed lips. 13. If your doctor has told you to take more than 1 puff, wait at least 15-30 seconds between puffs. This will help you get the best results from your medicine. Do not use the inhaler more than your doctor tells you to. 14. Put the cap back on the inhaler. 15. Follow the directions from your doctor or from the  inhaler package about cleaning the inhaler. If you use more than one inhaler, ask your doctor which inhalers to use and what order to use them in. Ask your doctor to help you figure out when you will need to refill your inhaler.  If you use a steroid inhaler, always rinse your mouth with water after your last puff, gargle and spit out the water. Do not swallow the water. GET HELP IF:  The inhaler medicine only partially helps to stop wheezing or shortness of breath.  You are having trouble using your inhaler.  You have some increase in thick spit (phlegm). GET HELP RIGHT AWAY IF:  The inhaler medicine does not help your wheezing or shortness of breath or you have tightness in your chest.  You have dizziness, headaches, or fast heart rate.  You have chills, fever, or night sweats.  You have a large increase of thick spit, or your thick spit is bloody. MAKE SURE YOU:   Understand these instructions.  Will watch your condition.  Will get help right away if you are not doing well or get worse. Document Released: 01/27/2008 Document Revised: 02/07/2013 Document Reviewed: 11/16/2012 [facility] Patient Information 2015 [place], [place]. This information is not intended to replace advice given to you by your health care provider. Make sure you discuss any questions you have with your health care provider.  Laryngitis At the top of your windpipe is your voice box. It is the source of your voice. Inside your voice box are 2 bands of muscles called vocal cords. When you breathe, your vocal cords are relaxed and open so that air can get into the lungs. When you decide to say something, these cords come together and vibrate. The sound from these vibrations goes into your throat and comes out through your mouth as sound. Laryngitis is an inflammation of the vocal cords that causes hoarseness, cough, loss of voice, sore throat, and dry throat. Laryngitis can be temporary (acute) or long-term (chronic).  Most cases of acute laryngitis improve with time.Chronic laryngitis lasts for more than 3 weeks. CAUSES Laryngitis can often be related to excessive smoking, talking, or yelling, as well as inhalation of toxic fumes and allergies. Acute laryngitis is usually caused by a viral infection, vocal strain, measles or mumps, or bacterial infections. Chronic laryngitis is usually caused by vocal cord strain, vocal cord injury, postnasal drip, growths on the vocal cords, or acid reflux. SYMPTOMS   Cough.  Sore throat.  Dry throat. RISK FACTORS  Respiratory infections.  Exposure to irritating substances, such as cigarette smoke, excessive amounts of alcohol, stomach acids, and workplace chemicals.  Voice trauma, such as vocal cord injury from shouting or speaking too loud. DIAGNOSIS  Your cargiver will perform a physical exam. During the physical exam, your caregiver will examine your throat. The most common sign of laryngitis is hoarseness. Laryngoscopy may be necessary to confirm the diagnosis of this condition. This procedure allows your  caregiver to look into the larynx. HOME CARE INSTRUCTIONS  Drink enough fluids to keep your urine clear or pale yellow.  Rest until you no longer have symptoms or as directed by your caregiver.  Breathe in moist air.  Take all medicine as directed by your caregiver.  Do not smoke.  Talk as little as possible (this includes whispering).  Write on paper instead of talking until your voice is back to normal.  Follow up with your caregiver if your condition has not improved after 10 days. SEEK MEDICAL CARE IF:   You have trouble breathing.  You cough up blood.  You have persistent fever.  You have increasing pain.  You have difficulty swallowing. MAKE SURE YOU:  Understand these instructions.  Will watch your condition.  Will get help right away if you are not doing well or get worse. Document Released: 04/19/2005 Document Revised:  07/12/2011 Document Reviewed: 06/25/2010 [facility] Patient Information 2015 [place], [place]. This information is not intended to replace advice given to you by your health care provider. Make sure you discuss any questions you have with your health care provider.  Emergency Department Resource Guide 1) Find a Doctor and Pay Out of Pocket Although you won't have to find out who is covered by your insurance plan, it is a good idea to ask around and get recommendations. You will then need to call the office and see if the doctor you have chosen will accept you as a new patient and what types of options they offer for patients who are self-pay. Some doctors offer discounts or will set up payment plans for their patients who do not have insurance, but you will need to ask so you aren't surprised when you get to your appointment.  2) Contact Your Local Health Department Not all health departments have doctors that can see patients for sick visits, but many do, so it is worth a call to see if yours does. If you don't know where your local health department is, you can check in your phone book. The CDC also has a tool to help you locate your state's health department, and many state websites also have listings of all of their local health departments.  3) Find a Walk-in Clinic If your illness is not likely to be very severe or complicated, you may want to try a walk in clinic. These are popping up all over the country in pharmacies, drugstores, and shopping centers. They're usually staffed by nurse practitioners or physician assistants that have been trained to treat common illnesses and complaints. They're usually fairly quick and inexpensive. However, if you have serious medical issues or chronic medical problems, these are probably not your best option.  No Primary Care Doctor: - Call Health Connect at  [phone] - they can help you locate a primary care doctor that  accepts your insurance, provides certain  services, etc. - Physician Referral Service- [phone]  Chronic Pain Problems: Organization         Address  Phone   Notes  [facility]  [phone] Patients need to be referred by their primary care doctor.   Medication Assistance: Organization         Address  Phone   Notes  [facility] Medication [facility] [address] [place], [place] [address] [phone] --Must be a resident of [facility] -- Must have NO insurance coverage whatsoever (no Medicaid/ Medicare, etc.) -- The pt. MUST have a primary  care doctor that directs their care regularly and follows them in the community   [facility]ist  [phone]   [employer]  [phone]    Agencies that provide inexpensive medical care: Organization         Address  Phone   Notes  [facility]  [phone]   [facility]    [phone]   [facility] [address] [place], [place] [address] [phone]   [facility] [address] [place] [place] [phone]   Planned Parenthood    [phone]   [facility]    [phone]   Community Health and [facility]  [address], [place] Phone:  [phone], Fax:  [phone] Hours of Operation:  9 am - 6 pm, M-F.  Also accepts Medicaid/Medicare and self-pay.  [facility]  [address], [place] Phone: [phone], Fax: [phone]. Hours of Operation:  8:30 am - 5:30 pm, M-F.  Also accepts Medicaid and self-pay.  [facility] [place] [address] [place], [place] Phone: [phone]   [facility] [address]ence [place], [place] [phone], Ext. [phone] Mondays & Thursdays: 7-9 AM.  First 15 patients are seen on a first come, first serve basis.    Medicaid-accepting [facility] Providers:  Organization         Address  Phone   Notes  [facility] [address], [place] [phone] Also accepts self-pay patients.  [facility] [address]s [place], [place]  [phone]   [facility] [address], [place] [phone]   [facility] [address] [place] [phone]   Renaye Rakers [address], [place]   [phone] Only accepts [place] Access [place] patients after they have their name applied to their card.   Self-Pay (no insurance) in [facility]:  Organization         Address  Phone   Notes  Sickle Cell Patients, [facility] [address] [place], [place] [phone]   [facility] [address] [place], [place] [phone]   [facility] [place]  [address], [place] [phone]   [facility]/Dr. Osei-Bonsu  [address], [place] or [address], [place] [phone] Phone number for both [place] and [place] locations is the same.  Urgent Medical and [facility] [address] [place] [phone]   [facility] [address], [place] or [address] [phone]   [facility] [address], [place] [phone], phone; [phone], fax Sees patients 1st and 3rd Saturday of every month.  Must not qualify for public or private insurance (i.e. Medicaid, Medicare, [place] Health Choice, Veterans' Benefits)  Household income should be no more than 200% of the poverty level The clinic cannot treat you if you are pregnant or think you are pregnant  Sexually transmitted diseases are not treated at the clinic.    Dental Care: Organization         Address  Phone  Notes  [facility] Department of [facility] [address] [place], [place] [phone] Accepts  children up to age 42 who are enrolled in Medicaid or [place] Health Choice; pregnant women with a Medicaid card; and children who have applied for Medicaid or [place] Health Choice, but were declined, whose parents can pay a reduced fee at time of service.  [facility] Department of [facility]  [address] [place] [phone] Accepts children up to age 20 who are enrolled in [place] or [place] Health Choice; pregnant women with a Medicaid card; and children who have applied for Medicaid or [place] Health Choice, but were declined, whose parents can pay a reduced fee at time of service.  [facility]  [address] [place], [place] [phone] Patients are seen by appointment only. Walk-ins are not accepted. [facility] will see patients 76 years of age and older. Monday - Tuesday (8am-5pm) Most Wednesdays (8:30-5pm) $30 per visit, cash only  [facility] Access [facility]  [address], [facility] [phone] Patients are seen by appointment only. Walk-ins are not accepted. [facility] will see patients 76 years of age and older. One Wednesday Evening (Monthly: Volunteer Based).  $30 per visit, cash only  [employer]  [phone] for adults; Children under age 47, call Graduate Pediatric Dentistry at [phone]. Children aged 8-14, please call [phone] to request a pediatric application.  Dental services are provided in all areas of dental care including fillings, crowns and bridges, complete and partial dentures, implants, gum treatment, root canals, and extractions. Preventive care is also provided. Treatment is provided to both adults and children. Patients are selected via a lottery and there is often a waiting list.   [facility] [address] [place]  [phone] [email or web address]   [facility] [address] [place], [place] [phone], Ext. [phone] Second and  Fourth Thursday of each month, opens at 6:30 AM; Clinic ends at 9 AM.  Patients are seen on a first-come first-served basis, and a limited number are seen during each clinic.   [facility]  [address] [place], [place] [phone]   Eligibility Requirements You must have lived in [place], [place], or [place] counties for at least the last three months.   You cannot be eligible for state or federal sponsored [place], including [employer], [place], or [employer].   You generally cannot be eligible for healthcare insurance through your employer.    How to apply: Eligibility screenings are held every Tuesday and Wednesday afternoon from 1:00 pm until 4:00 pm. You do not need an appointment for the interview!  [facility] [address], [place], [place] [phone]   [facility] Health Department  [phone]   [facility] Health Department  [phone]   [facility] Health Department  [phone]    Behavioral Health Resources in the Community: Intensive Outpatient Programs Organization         Address  Phone  Notes  [facility] [address] [place], [place] [phone]   [facility] Outpatient [address], [place], [place] [phone]   [facility] [address] [place], [place]  [phone]   [facility] [address]  [place], [place] [phone] or [phone]   Substance Abuse Resources Organization         Address  Phone  Notes  [facility] Drug Services  [phone]   Addiction  [facility]  [phone]   [facility]  [phone]   Floydene Flock  [phone]   Residential & Outpatient Substance Abuse Program  [phone]   Psychological Services Organization         Address  Phone  Notes  [facility] Behavioral Health  [phone]   [facility] Services  [phone]   [facility] Mental Health  [address], [place] [phone] or [phone]    Mobile Crisis Teams Organization         Address  Phone  Notes  [facility], Mobile Crisis Care Unit  [phone]   [facility]  [address] [place], [place] [phone]   Doristine Locks [address] [place] [phone]    Self-Help/Support Groups Organization         Address  Phone             Notes  [facility] [place] - variety of support groups  336- I7437963 Call for more information  Narcotics Anonymous (NA), [facility] [address], [employer] [place]  2 meetings at this location   Statistician         Address  Phone  Notes  [facility] [address],    [place]  [phone]   [facility]  [address] [place] [address], [place], [place] [phone]   [facility] [address] [place], [place] [phone] Admissions: 8am-3pm M-F  [facility] [address],    [place], [place] [phone]   [facility] [address] [place], [place], [place] [phone]   [facility] [address]  [place], [place] [phone]   [facility] - Intensive Outpatient [address], [place], [place] [phone]   [facility] (Addiction Recovery [facility] [address]  [place], [place] [phone] or [phone]   [facility] ([facility]) [address] [place], [place] [phone] Accepts Medicaid  [facility] [place] [address],  [place] [phone] Substance Abuse/Addiction Treatment   [facility] Organization         Address  Phone  Notes  [facility]  [phone]   Angie Fava, PhD [address] Er[address] Kn[address] [place], [place]   [phone] or [phone]   [facility]al   [address] [place], [place] [phone]   [facility] [address], [place], [place] [phone] Insurance/Medicaid/sponsorship through [facility] and Families [address]                                    [place], [place] [phone] Therapy/tele-psych/case  [facility] [address], [place] [phone]    Dr. Lolly Mustache  [phone]   [facility] [place]  [employer] [facility] Dept. 1) [address], [place] 2) [address], [place] 3)  [address], [place] [phone]  [phone]   [facility] Child Abuse Hotline [phone]) [phone] or [phone] (After Hours)

## 2013-12-25 NOTE — ED Provider Notes (Signed)
CSN: [id number]     Arrival date & time 12/25/13  0821 History   First MD Initiated Contact with Patient 12/25/13 0848     Chief Complaint  Patient presents with  . Shortness of Breath     (Consider location/radiation/quality/duration/timing/severity/associated sxs/prior Treatment) HPI Comments: April [name] is a 43 y.o. female with a PMHx of DM2, who presents to the ED with complaints of "a few days" of a cough which lead to development of shortness of breath approximately one day ago. Patient states that she developed a cough with yellow sputum production and last night he became increasingly harder for her to breathe. She states that she has had bronchitis in the past with weather changes, and this feels very similar to those episodes although this came on sporadically without a change in weather. She states that she has also had a scratchy throat with some voice hoarseness. Symptoms are worse with coughing and smoking, and unrelieved by delsym and alka seltzer. She states that she is a smoker, 1/2ppd. Denies any fevers, chills, CP, hemoptysis, HA, rhinorrhea, ear pain/discharge, sinus congestion, postnasal drip, claudication, diaphoresis, neck pain, LAD, abd pain, N/V/D, rash, syncope, or weakness. No LE swelling or hx of DVT/PE. No hx of asthma or COPD, does not use an inhaler on a regular basis. No known allergens or exposures to sick contacts.   Patient is a 43 y.o. female presenting with shortness of breath. The history is provided by the patient. No language interpreter was used.  Shortness of Breath Severity:  Mild Onset quality:  Gradual Duration:  1 day Timing:  Constant Progression:  Unchanged Chronicity:  New Context: smoke exposure   Context: not animal exposure, not known allergens, not pollens, not URI and not weather changes   Relieved by:  Nothing Worsened by:  Coughing and smoke exposure Ineffective treatments: delsym and alka seltzer. Associated symptoms:  cough, sore throat ("scratchy" with a hoarse voice), sputum production (yellowish) and wheezing   Associated symptoms: no abdominal pain, no chest pain, no claudication, no diaphoresis, no ear pain, no fever, no headaches, no hemoptysis, no neck pain, no PND, no rash, no syncope, no swollen glands and no vomiting   Risk factors: tobacco use (1/2 ppd)   Risk factors: no hx of PE/DVT     Past Medical History  Diagnosis Date  . Diabetes mellitus without complication    Past Surgical History  Procedure Laterality Date  . No past surgeries    . Abdominal hysterectomy     No family history on file. History  Substance Use Topics  . Smoking status: Current Every Day Smoker    Types: Cigarettes  . Smokeless tobacco: Never Used  . Alcohol Use: Yes   OB History   Grav Para Term Preterm Abortions TAB SAB Ect Mult Living                 Review of Systems  Constitutional: Negative for fever, chills and diaphoresis.  HENT: Positive for sore throat ("scratchy" with a hoarse voice) and voice change ("hoarse"). Negative for congestion, ear discharge, ear pain, postnasal drip, rhinorrhea, sinus pressure, sneezing and trouble swallowing.   Eyes: Negative for pain and discharge.  Respiratory: Positive for cough, sputum production (yellowish), shortness of breath and wheezing. Negative for hemoptysis and chest tightness.   Cardiovascular: Negative for chest pain, claudication, leg swelling, syncope and PND.  Gastrointestinal: Negative for nausea, vomiting, abdominal pain, diarrhea and constipation.  Musculoskeletal: Negative for arthralgias, myalgias and neck pain.  Skin: Negative for rash.  Allergic/Immunologic: Negative for environmental allergies and immunocompromised state.  Neurological: Negative for dizziness, syncope, weakness, light-headedness and headaches.  Hematological: Negative for adenopathy.  10 Systems reviewed and are negative for acute change except as noted in the  HPI.     Allergies  Review of patient's allergies indicates no known allergies.  Home Medications   Prior to Admission medications   Medication Sig Start Date End Date Taking? Authorizing Provider  dextromethorphan (DELSYM) 30 MG/5ML liquid Take 60 mg by mouth as needed for cough.   Yes Historical Provider, MD  DM-Phenylephrine-Acetaminophen (ALKA-SELTZER PLS SINUS & COUGH) 10-5-325 MG CAPS Take 2 capsules by mouth daily as needed (for cold).   Yes Historical Provider, MD  albuterol (PROVENTIL HFA;VENTOLIN HFA) 108 (90 BASE) MCG/ACT inhaler Inhale 2 puffs into the lungs every 2 (two) hours as needed for wheezing or shortness of breath (cough). 12/25/13   Dalayah Deahl Strupp Camprubi-Soms, PA-C  guaifenesin (ROBITUSSIN) 100 MG/5ML syrup Take 10 mLs (200 mg total) by mouth every 4 (four) hours as needed for cough or congestion. 12/25/13   Tunisha Ruland Strupp Camprubi-Soms, PA-C  predniSONE (DELTASONE) 20 MG tablet 3 tabs po daily x 3 days. TAKE WITH BREAKFAST 12/25/13   Raynette Arras Strupp Camprubi-Soms, PA-C  Spacer/Aero-Holding Chambers (AEROCHAMBER PLUS WITH MASK) inhaler Use as instructed 12/25/13   Donnita Falls Camprubi-Soms, PA-C   BP 138/80  Pulse 84  Temp(Src) 97.6 F (36.4 C) (Oral)  Resp 16  Wt 253 lb (114.76 kg)  SpO2 96%  LMP 11/27/2010 Physical Exam  Nursing note and vitals reviewed. Constitutional: She is oriented to person, place, and time. Vital signs are normal. She appears well-developed and well-nourished. No distress.  VSS, NAD, afebrile  HENT:  Head: Normocephalic and atraumatic.  Nose: Nose normal.  Mouth/Throat: Uvula is midline, oropharynx is clear and moist and mucous membranes are normal.  Nose and mouth clear of erythema or edema, MMM  Eyes: Conjunctivae and EOM are normal. Pupils are equal, round, and reactive to light. Right eye exhibits no discharge. Left eye exhibits no discharge.  Neck: Trachea normal and normal range of motion. Neck supple. No JVD present.   Hoarseness of voice noted, no stridor or tracheal abnormalities  Cardiovascular: Normal rate, regular rhythm, normal heart sounds and intact distal pulses.  Exam reveals no gallop and no friction rub.   No murmur heard. Pulmonary/Chest: Effort normal. Not tachypneic. No respiratory distress. She has no decreased breath sounds. She has wheezes. She has rhonchi. She has no rales.  Diffuse expiratory wheezes and rhonchi in all lung fields that do not clear with cough, audible upper airway sounds transmitted, no increased WOB or resp distress, no tachypnea, no focal decreased breath sounds  Abdominal: Normal appearance. She exhibits no distension.  Musculoskeletal: Normal range of motion.  Neurological: She is alert and oriented to person, place, and time.  Skin: Skin is warm, dry and intact. No rash noted.  Psychiatric: She has a normal mood and affect.    ED Course  Procedures (including critical care time) Labs Review Labs Reviewed - No data to display  Imaging Review Dg Chest 2 View  12/25/2013   CLINICAL DATA:  Cough and difficulty breathing  EXAM: CHEST  2 VIEW  COMPARISON:  August 15, 2013  FINDINGS: Lungs are clear. Heart size and pulmonary vascularity are normal. No adenopathy. No bone lesions.  IMPRESSION: No edema or consolidation.   Electronically Signed   By: Bretta Bang M.D.   On: 12/25/2013  09:37     EKG Interpretation None      MDM   Final diagnoses:  Bronchitis  Laryngitis  Wheezing    42y/o female with cough and increasing SOB x1 day, lung sounds junky in all lung fields, appears to be bronchitis with laryngitis, doubt CHF or COPD. Will obtain CXR and give nebs with prednisone and reassess. Plan for possibly more nebs depending on results with first, then will ambulate pt to ensure maintains O2 sats >90% on RA, then home with rx for prednisone burst, inhaler, and mucinex. Will reassess shortly.   10:30 AM CXR clear. Recheck lung exam improved but still  having some end expiratory wheeze and minimal rhonchi in lower fields. Oxygen sats 99% now. Will repeat nebs, then ambulate pt. Pt feels improved and breathing well. Will reassess shortly.   12:32 PM 2nd nebs improved lung exam, now without wheezes or rhonchi. Oxygen >96% on RA throughout ED stay. Pt will be ambulated and plan for d/c as long as she can maintain O2 sats. Rx for prednisone, inhaler, and mucinex given. I explained the diagnosis and have given explicit precautions to return to the ER including for any other new or worsening symptoms. The patient understands and accepts the medical plan as it's been dictated and I have answered their questions. Discharge instructions concerning home care and prescriptions have been given. The patient is STABLE and is discharged to home in good condition.  BP 140/65  Pulse 96  Temp(Src) 97.6 F (36.4 C) (Oral)  Resp 18  Wt 253 lb (114.76 kg)  SpO2 96%  LMP 11/27/2010  Meds ordered this encounter  Medications  . predniSONE (DELTASONE) tablet 60 mg    Sig:   . albuterol (PROVENTIL) (2.5 MG/3ML) 0.083% nebulizer solution 5 mg    Sig:   . ipratropium (ATROVENT) nebulizer solution 0.5 mg    Sig:   . albuterol (PROVENTIL) (2.5 MG/3ML) 0.083% nebulizer solution 5 mg    Sig:   . ipratropium (ATROVENT) nebulizer solution 0.5 mg    Sig:   . predniSONE (DELTASONE) 20 MG tablet    Sig: 3 tabs po daily x 3 days. TAKE WITH BREAKFAST    Dispense:  9 tablet    Refill:  0    Order Specific Question:  Supervising Provider    Answer:  Eber Hong D [[id number]]  . albuterol (PROVENTIL HFA;VENTOLIN HFA) 108 (90 BASE) MCG/ACT inhaler    Sig: Inhale 2 puffs into the lungs every 2 (two) hours as needed for wheezing or shortness of breath (cough).    Dispense:  1 Inhaler    Refill:  0    Order Specific Question:  Supervising Provider    Answer:  Eber Hong D [[id number]]  . Spacer/Aero-Holding Chambers (AEROCHAMBER PLUS WITH MASK) inhaler    Sig: Use as  instructed    Dispense:  1 each    Refill:  2    Order Specific Question:  Supervising Provider    Answer:  Eber Hong D [[id number]]  . guaifenesin (ROBITUSSIN) 100 MG/5ML syrup    Sig: Take 10 mLs (200 mg total) by mouth every 4 (four) hours as needed for cough or congestion.    Dispense:  120 mL    Refill:  0    Order Specific Question:  Supervising Provider    Answer:  Vida Roller [address] Camprubi-Soms, PA-C 12/25/13 1330

## 2013-12-25 NOTE — ED Notes (Signed)
Pt alert, arrives from home, c/o cough, sob, onset was yesterday, resp even unlabored, moist npc noted

## 2014-08-26 ENCOUNTER — Encounter (HOSPITAL_BASED_OUTPATIENT_CLINIC_OR_DEPARTMENT_OTHER): Payer: Self-pay | Admitting: *Deleted

## 2014-08-26 ENCOUNTER — Emergency Department (HOSPITAL_BASED_OUTPATIENT_CLINIC_OR_DEPARTMENT_OTHER)
Admission: EM | Admit: 2014-08-26 | Discharge: 2014-08-26 | Disposition: A | Discharge status: Home / Self Care | Payer: 59 | Attending: Emergency Medicine | Admitting: Emergency Medicine

## 2014-08-26 ENCOUNTER — Emergency Department (HOSPITAL_BASED_OUTPATIENT_CLINIC_OR_DEPARTMENT_OTHER): Payer: 59

## 2014-08-26 DIAGNOSIS — Z79899 Other long term (current) drug therapy: Secondary | ICD-10-CM | POA: Diagnosis not present

## 2014-08-26 DIAGNOSIS — N83209 Unspecified ovarian cyst, unspecified side: Secondary | ICD-10-CM

## 2014-08-26 DIAGNOSIS — R102 Pelvic and perineal pain: Secondary | ICD-10-CM

## 2014-08-26 DIAGNOSIS — N830 Follicular cyst of ovary: Secondary | ICD-10-CM | POA: Insufficient documentation

## 2014-08-26 DIAGNOSIS — Z720 Tobacco use: Secondary | ICD-10-CM | POA: Diagnosis not present

## 2014-08-26 DIAGNOSIS — E119 Type 2 diabetes mellitus without complications: Secondary | ICD-10-CM | POA: Insufficient documentation

## 2014-08-26 DIAGNOSIS — Z7952 Long term (current) use of systemic steroids: Secondary | ICD-10-CM | POA: Insufficient documentation

## 2014-08-26 DIAGNOSIS — Z90710 Acquired absence of both cervix and uterus: Secondary | ICD-10-CM | POA: Diagnosis not present

## 2014-08-26 DIAGNOSIS — R1032 Left lower quadrant pain: Secondary | ICD-10-CM | POA: Diagnosis present

## 2014-08-26 LAB — CBC WITH DIFFERENTIAL/PLATELET
Basophils Absolute: 0 10*3/uL (ref 0.0–0.1)
Basophils Relative: 1 % (ref 0–1)
Eosinophils Absolute: 0.8 10*3/uL — ABNORMAL HIGH (ref 0.0–0.7)
Eosinophils Relative: 10 % — ABNORMAL HIGH (ref 0–5)
HCT: 42 % (ref 36.0–46.0)
Hemoglobin: 14.1 g/dL (ref 12.0–15.0)
LYMPHS ABS: 1.9 10*3/uL (ref 0.7–4.0)
Lymphocytes Relative: 25 % (ref 12–46)
MCH: 27.6 pg (ref 26.0–34.0)
MCHC: 33.6 g/dL (ref 30.0–36.0)
MCV: 82.2 fL (ref 78.0–100.0)
Monocytes Absolute: 0.6 10*3/uL (ref 0.1–1.0)
Monocytes Relative: 8 % (ref 3–12)
NEUTROS ABS: 4.3 10*3/uL (ref 1.7–7.7)
Neutrophils Relative %: 56 % (ref 43–77)
Platelets: 224 10*3/uL (ref 150–400)
RBC: 5.11 MIL/uL (ref 3.87–5.11)
RDW: 14.8 % (ref 11.5–15.5)
WBC: 7.7 10*3/uL (ref 4.0–10.5)

## 2014-08-26 LAB — URINALYSIS, ROUTINE W REFLEX MICROSCOPIC
BILIRUBIN URINE: NEGATIVE
Glucose, UA: NEGATIVE mg/dL
Hgb urine dipstick: NEGATIVE
Ketones, ur: NEGATIVE mg/dL
Nitrite: NEGATIVE
PH: 6.5 (ref 5.0–8.0)
PROTEIN: NEGATIVE mg/dL
SPECIFIC GRAVITY, URINE: 1.028 (ref 1.005–1.030)
UROBILINOGEN UA: 1 mg/dL (ref 0.0–1.0)

## 2014-08-26 LAB — BASIC METABOLIC PANEL
Anion gap: 5 (ref 5–15)
BUN: 13 mg/dL (ref 6–23)
CO2: 27 mmol/L (ref 19–32)
Calcium: 8.7 mg/dL (ref 8.4–10.5)
Chloride: 106 mmol/L (ref 96–112)
Creatinine, Ser: 0.9 mg/dL (ref 0.50–1.10)
GFR calc Af Amer: >90 mL/min (ref >90)
GFR calc non Af Amer: 78 mL/min — ABNORMAL LOW (ref >90)
Glucose, Bld: 104 mg/dL — ABNORMAL HIGH (ref 70–99)
POTASSIUM: 4 mmol/L (ref 3.5–5.1)
Sodium: 138 mmol/L (ref 135–145)

## 2014-08-26 LAB — URINE MICROSCOPIC-ADD ON

## 2014-08-26 MED ORDER — HYDROCODONE-ACETAMINOPHEN 5-325 MG PO TABS
2.0000 | ORAL_TABLET | Freq: Once | ORAL | Status: AC
  Administered 2014-08-26: 2 via ORAL
  Filled 2014-08-26: qty 2

## 2014-08-26 MED ORDER — HYDROCODONE-ACETAMINOPHEN 5-325 MG PO TABS: 1.0000 | ORAL_TABLET | Freq: Four times a day (QID) | ORAL | Status: DC | PRN

## 2014-08-26 NOTE — ED Notes (Signed)
Abdominal pain left lower quadrant since Saturday.

## 2014-08-26 NOTE — ED Provider Notes (Signed)
CSN: [id number]     Arrival date & time 08/26/14  1520 History  This chart was scribed for Geoffery Lyons, MD by Ronney Lion, AED Scribe. This patient was seen in room MH08/MH08 and the patient's care was started at 5:18 PM.    Chief Complaint  Patient presents with  . Abdominal Pain   Patient is a 44 y.o. female presenting with abdominal pain. The history is provided by the patient. No language interpreter was used.  Abdominal Pain Pain location:  LLQ Pain quality: sharp and stabbing   Pain severity:  Moderate Onset quality:  Gradual Duration:  2 days Timing:  Intermittent Progression:  Unchanged Chronicity:  Recurrent Relieved by:  Nothing Worsened by:  Nothing tried Ineffective treatments:  None tried Associated symptoms: no constipation, no diarrhea, no dysuria and no hematuria   Risk factors: not pregnant      HPI Comments: April [name] is a 44 y.o. female with a history of partial hysterectomy who presents to the Emergency Department complaining of sharp, stabbing, left lower abdominal pain that has been intermittent for the past 2 days. Patient was seen here 2 years ago for an ovarian cyst blood clot and states this feels the same. Patient was treated with medication and 1 week bed rest, and it resolved with the medication. She hasn't had any problems since. Patient had called her OB-GYN office to try to be seen there, but has been unable to schedule any appointment. She uses an inhaler regularly. Patient states she has no other chronic medical conditions. She denies fever, difficulty urinating, dysuria, hematuria, constipation, or diarrhea. Patient drove here today, but her husband is coming soon.  Past Medical History  Diagnosis Date  . Diabetes mellitus without complication    Past Surgical History  Procedure Laterality Date  . No past surgeries    . Abdominal hysterectomy     No family history on file. History  Substance Use Topics  . Smoking status: Current  Every Day Smoker -- 0.50 packs/day    Types: Cigarettes  . Smokeless tobacco: Never Used  . Alcohol Use: Yes   OB History    No data available     Review of Systems  Gastrointestinal: Positive for abdominal pain. Negative for diarrhea and constipation.  Genitourinary: Negative for dysuria, hematuria and difficulty urinating.  All other systems reviewed and are negative.   Allergies  Review of patient's allergies indicates no known allergies.  Home Medications   Prior to Admission medications   Medication Sig Start Date End Date Taking? Authorizing Provider  albuterol (PROVENTIL HFA;VENTOLIN HFA) 108 (90 BASE) MCG/ACT inhaler Inhale 2 puffs into the lungs every 2 (two) hours as needed for wheezing or shortness of breath (cough). 12/25/13   Mercedes Camprubi-Soms, PA-C  dextromethorphan (DELSYM) 30 MG/5ML liquid Take 60 mg by mouth as needed for cough.    Historical Provider, MD  DM-Phenylephrine-Acetaminophen (ALKA-SELTZER PLS SINUS & COUGH) 10-5-325 MG CAPS Take 2 capsules by mouth daily as needed (for cold).    Historical Provider, MD  guaifenesin (ROBITUSSIN) 100 MG/5ML syrup Take 10 mLs (200 mg total) by mouth every 4 (four) hours as needed for cough or congestion. 12/25/13   Mercedes Camprubi-Soms, PA-C  predniSONE (DELTASONE) 20 MG tablet 3 tabs po daily x 3 days. TAKE WITH BREAKFAST 12/25/13   Mercedes Camprubi-Soms, PA-C  Spacer/Aero-Holding Chambers (AEROCHAMBER PLUS WITH MASK) inhaler Use as instructed 12/25/13   Mercedes Camprubi-Soms, PA-C   BP 114/46 mmHg  Pulse 72  Temp(Src)  98.7 F (37.1 C) (Oral)  Resp 20  Ht  (1.651 m)  Wt 253 lb (114.76 kg)  BMI 42.10 kg/m2  SpO2 98%  LMP 11/27/2010 Physical Exam  Constitutional: She is oriented to person, place, and time. She appears well-developed and well-nourished. No distress.  HENT:  Head: Normocephalic and atraumatic.  Right Ear: Hearing normal.  Left Ear: Hearing normal.  Nose: Nose normal.  Mouth/Throat:  Oropharynx is clear and moist and mucous membranes are normal.  Eyes: Conjunctivae and EOM are normal. Pupils are equal, round, and reactive to light.  Neck: Normal range of motion. Neck supple.  Cardiovascular: Regular rhythm, S1 normal and S2 normal.  Exam reveals no gallop and no friction rub.   No murmur heard. Pulmonary/Chest: Effort normal and breath sounds normal. No respiratory distress. She exhibits no tenderness.  Abdominal: Soft. Normal appearance and bowel sounds are normal. There is no hepatosplenomegaly. There is tenderness. There is no rebound, no guarding, no tenderness at McBurney's point and negative Murphy's sign. No hernia.  TTP to LLQ.  Musculoskeletal: Normal range of motion.  Neurological: She is alert and oriented to person, place, and time. She has normal strength. No cranial nerve deficit or sensory deficit. Coordination normal. GCS eye subscore is 4. GCS verbal subscore is 5. GCS motor subscore is 6.  Skin: Skin is warm, dry and intact. No rash noted. No cyanosis.  Psychiatric: She has a normal mood and affect. Her speech is normal and behavior is normal. Thought content normal.  Nursing note and vitals reviewed.   ED Course  Procedures (including critical care time)  DIAGNOSTIC STUDIES: Oxygen Saturation is 98% on room air, normal by my interpretation.    COORDINATION OF CARE: 5:22 PM - Discussed treatment plan with pt at bedside which includes pain medication and diagnostic tests, and pt agreed to plan.   Labs Review Labs Reviewed  URINALYSIS, ROUTINE W REFLEX MICROSCOPIC - Abnormal; Notable for the following:    Leukocytes, UA SMALL (*)    All other components within normal limits  URINE MICROSCOPIC-ADD ON - Abnormal; Notable for the following:    Squamous Epithelial / LPF FEW (*)    All other components within normal limits  BASIC METABOLIC PANEL - Abnormal; Notable for the following:    Glucose, Bld 104 (*)    GFR calc non Af Amer 78 (*)    All other  components within normal limits  CBC WITH DIFFERENTIAL/PLATELET - Abnormal; Notable for the following:    Eosinophils Relative 10 (*)    Eosinophils Absolute 0.8 (*)    All other components within normal limits    Imaging Review US Transvaginal Non-ob  08/26/2014   CLINICAL DATA:  Pelvic pain for 2-3 days.  History of hysterectomy.  EXAM: TRANSABDOMINAL AND TRANSVAGINAL ULTRASOUND OF PELVIS  TECHNIQUE: Both transabdominal and transvaginal ultrasound examinations of the pelvis were performed. Transabdominal technique was performed for global imaging of the pelvis including uterus, ovaries, adnexal regions, and pelvic cul-de-sac. It was necessary to proceed with endovaginal exam following the transabdominal exam to visualize the a bilateral ovaries.  COMPARISON:  Pelvic ultrasound - 08/21/2012  FINDINGS: Uterus  Surgically absent.  Right ovary  Normal in size measuring 2.6 x 1.7 x 1.4 cm. No discrete right-sided ovarian or adnexal mass.  Left ovary  Only seen on transabdominal imaging. The left ovary is borderline enlarged measuring 4.6 x 3.6 x 4.1 cm though this is somewhat similar to prior pelvic ultrasound performed 08/21/2012 at which  time the left ovary measured approximately 5.7 x 4.9 x 4.2 cm though at that time the left ovary was noted to contain an approximately 3.3 cm anechoic cyst. There is no discrete left-sided ovarian or adnexal lesion though the left ovary demonstrates a slightly heterogeneous appearance potentially representative of containing a hemorrhagic cyst (representative image 6).  Other findings  A small amount of echogenic fluid, presumably blood products, is seen within the pelvic cul-de-sac and left adnexa (image 12 and 27).  IMPRESSION: 1. The left ovary is only seen on trans abdominal images though appears borderline enlarged with a slightly heterogeneous appearance, suboptimally evaluated though favored to represent a hemorrhagic cyst. This differential consideration is supported  by a small amount of blood products within the pelvic cul-de-sac. Clinical correlation is advised. Short-interval follow up ultrasound in 6-12 weeks is recommended, preferably during the week following the patient's normal menses. 2. No discrete right-sided ovarian or adnexal lesion. 3. Post hysterectomy.   Electronically Signed   By: Simonne ComeJohn  Watts M.D.   On: 08/26/2014 19:39   [place] Pelvis Complete  08/26/2014   CLINICAL DATA:  Pelvic pain for 2-3 days.  History of hysterectomy.  EXAM: TRANSABDOMINAL AND TRANSVAGINAL ULTRASOUND OF PELVIS  TECHNIQUE: Both transabdominal and transvaginal ultrasound examinations of the pelvis were performed. Transabdominal technique was performed for global imaging of the pelvis including uterus, ovaries, adnexal regions, and pelvic cul-de-sac. It was necessary to proceed with endovaginal exam following the transabdominal exam to visualize the a bilateral ovaries.  COMPARISON:  Pelvic ultrasound - 08/21/2012  FINDINGS: Uterus  Surgically absent.  Right ovary  Normal in size measuring 2.6 x 1.7 x 1.4 cm. No discrete right-sided ovarian or adnexal mass.  Left ovary  Only seen on transabdominal imaging. The left ovary is borderline enlarged measuring 4.6 x 3.6 x 4.1 cm though this is somewhat similar to prior pelvic ultrasound performed 08/21/2012 at which time the left ovary measured approximately 5.7 x 4.9 x 4.2 cm though at that time the left ovary was noted to contain an approximately 3.3 cm anechoic cyst. There is no discrete left-sided ovarian or adnexal lesion though the left ovary demonstrates a slightly heterogeneous appearance potentially representative of containing a hemorrhagic cyst (representative image 6).  Other findings  A small amount of echogenic fluid, presumably blood products, is seen within the pelvic cul-de-sac and left adnexa (image 12 and 27).  IMPRESSION: 1. The left ovary is only seen on trans abdominal images though appears borderline enlarged with a slightly  heterogeneous appearance, suboptimally evaluated though favored to represent a hemorrhagic cyst. This differential consideration is supported by a small amount of blood products within the pelvic cul-de-sac. Clinical correlation is advised. Short-interval follow up ultrasound in 6-12 weeks is recommended, preferably during the week following the patient's normal menses. 2. No discrete right-sided ovarian or adnexal lesion. 3. Post hysterectomy.   Electronically Signed   By: Simonne ComeJohn  Watts M.D.   On: 08/26/2014 19:39    MDM   Final diagnoses:  None   Patient presents with LLQ pain.  US shows what appears to be a hemorrhagic ovarian cyst, but no other emergent pathology.  She is hemodynamically stable and is feeling better with meds in the ED.  Will discharge with pain meds, followup with GYN.  I personally performed the services described in this documentation, which was scribed in my presence. The recorded information has been reviewed and is accurate.    Geoffery Lyonsouglas Hedaya Latendresse, MD 08/27/14 [phone]

## 2014-08-26 NOTE — Discharge Instructions (Signed)
Hydrocodone as prescribed as needed for pain.  Follow-up with your gynecologist within the next week, and return to the ER if your symptoms significantly worsen or change.   Ovarian Cyst An ovarian cyst is a fluid-filled sac that forms on an ovary. The ovaries are small organs that produce eggs in women. Various types of cysts can form on the ovaries. Most are not cancerous. Many do not cause problems, and they often go away on their own. Some may cause symptoms and require treatment. Common types of ovarian cysts include:  Functional cysts--These cysts may occur every month during the menstrual cycle. This is normal. The cysts usually go away with the next menstrual cycle if the woman does not get pregnant. Usually, there are no symptoms with a functional cyst.  Endometrioma cysts--These cysts form from the tissue that lines the uterus. They are also called "chocolate cysts" because they become filled with blood that turns brown. This type of cyst can cause pain in the lower abdomen during intercourse and with your menstrual period.  Cystadenoma cysts--This type develops from the cells on the outside of the ovary. These cysts can get very big and cause lower abdomen pain and pain with intercourse. This type of cyst can twist on itself, cut off its blood supply, and cause severe pain. It can also easily rupture and cause a lot of pain.  Dermoid cysts--This type of cyst is sometimes found in both ovaries. These cysts may contain different kinds of body tissue, such as skin, teeth, hair, or cartilage. They usually do not cause symptoms unless they get very big.  Theca lutein cysts--These cysts occur when too much of a certain hormone (human chorionic gonadotropin) is produced and overstimulates the ovaries to produce an egg. This is most common after procedures used to assist with the conception of a baby (in vitro fertilization). CAUSES   Fertility drugs can cause a condition in which multiple  large cysts are formed on the ovaries. This is called ovarian hyperstimulation syndrome.  A condition called polycystic ovary syndrome can cause hormonal imbalances that can lead to nonfunctional ovarian cysts. SIGNS AND SYMPTOMS  Many ovarian cysts do not cause symptoms. If symptoms are present, they may include:  Pelvic pain or pressure.  Pain in the lower abdomen.  Pain during sexual intercourse.  Increasing girth (swelling) of the abdomen.  Abnormal menstrual periods.  Increasing pain with menstrual periods.  Stopping having menstrual periods without being pregnant. DIAGNOSIS  These cysts are commonly found during a routine or annual pelvic exam. Tests may be ordered to find out more about the cyst. These tests may include:  Ultrasound.  X-ray of the pelvis.  CT scan.  MRI.  Blood tests. TREATMENT  Many ovarian cysts go away on their own without treatment. Your health care provider may want to check your cyst regularly for 2-3 months to see if it changes. For women in menopause, it is particularly important to monitor a cyst closely because of the higher rate of ovarian cancer in menopausal women. When treatment is needed, it may include any of the following:  A procedure to drain the cyst (aspiration). This may be done using a long needle and ultrasound. It can also be done through a laparoscopic procedure. This involves using a thin, lighted tube with a tiny camera on the end (laparoscope) inserted through a small incision.  Surgery to remove the whole cyst. This may be done using laparoscopic surgery or an open surgery involving a larger  incision in the lower abdomen.  Hormone treatment or birth control pills. These methods are sometimes used to help dissolve a cyst. HOME CARE INSTRUCTIONS   Only take over-the-counter or prescription medicines as directed by your health care provider.  Follow up with your health care provider as directed.  Get regular pelvic exams  and Pap tests. SEEK MEDICAL CARE IF:   Your periods are late, irregular, or painful, or they stop.  Your pelvic pain or abdominal pain does not go away.  Your abdomen becomes larger or swollen.  You have pressure on your bladder or trouble emptying your bladder completely.  You have pain during sexual intercourse.  You have feelings of fullness, pressure, or discomfort in your stomach.  You lose weight for no apparent reason.  You feel generally ill.  You become constipated.  You lose your appetite.  You develop acne.  You have an increase in body and facial hair.  You are gaining weight, without changing your exercise and eating habits.  You think you are pregnant. SEEK IMMEDIATE MEDICAL CARE IF:   You have increasing abdominal pain.  You feel sick to your stomach (nauseous), and you throw up (vomit).  You develop a fever that comes on suddenly.  You have abdominal pain during a bowel movement.  Your menstrual periods become heavier than usual. MAKE SURE YOU:  Understand these instructions.  Will watch your condition.  Will get help right away if you are not doing well or get worse. Document Released: 04/19/2005 Document Revised: 04/24/2013 Document Reviewed: 12/25/2012 [facility] Patient Information 2015 [place], [place]. This information is not intended to replace advice given to you by your health care provider. Make sure you discuss any questions you have with your health care provider.

## 2014-10-17 ENCOUNTER — Other Ambulatory Visit: Payer: Self-pay | Admitting: Obstetrics and Gynecology

## 2014-10-17 DIAGNOSIS — R1032 Left lower quadrant pain: Secondary | ICD-10-CM

## 2014-10-17 DIAGNOSIS — N83202 Unspecified ovarian cyst, left side: Secondary | ICD-10-CM

## 2014-10-18 ENCOUNTER — Inpatient Hospital Stay: Admission: RE | Admit: 2014-10-18 | Payer: 59 | Source: Ambulatory Visit

## 2014-10-18 ENCOUNTER — Ambulatory Visit
Admission: RE | Admit: 2014-10-18 | Discharge: 2014-10-18 | Disposition: A | Discharge status: Home / Self Care | Payer: 59 | Source: Ambulatory Visit | Attending: Obstetrics and Gynecology | Admitting: Obstetrics and Gynecology

## 2014-10-18 DIAGNOSIS — R1032 Left lower quadrant pain: Secondary | ICD-10-CM

## 2014-10-18 DIAGNOSIS — N83202 Unspecified ovarian cyst, left side: Secondary | ICD-10-CM

## 2014-10-18 MED ORDER — IOPAMIDOL (ISOVUE-300) INJECTION 61%
125.0000 mL | Freq: Once | INTRAVENOUS | Status: AC | PRN
  Administered 2014-10-18: 125 mL via INTRAVENOUS

## 2014-12-05 ENCOUNTER — Emergency Department (HOSPITAL_COMMUNITY)
Admission: EM | Admit: 2014-12-05 | Discharge: 2014-12-05 | Discharge status: Against Medical Advice | Payer: 59 | Attending: Emergency Medicine | Admitting: Emergency Medicine

## 2014-12-05 ENCOUNTER — Encounter (HOSPITAL_COMMUNITY): Payer: Self-pay | Admitting: *Deleted

## 2014-12-05 DIAGNOSIS — R51 Headache: Secondary | ICD-10-CM | POA: Insufficient documentation

## 2014-12-05 DIAGNOSIS — Z720 Tobacco use: Secondary | ICD-10-CM | POA: Diagnosis not present

## 2014-12-05 DIAGNOSIS — E119 Type 2 diabetes mellitus without complications: Secondary | ICD-10-CM | POA: Diagnosis not present

## 2014-12-05 DIAGNOSIS — H53149 Visual discomfort, unspecified: Secondary | ICD-10-CM | POA: Insufficient documentation

## 2014-12-05 DIAGNOSIS — M542 Cervicalgia: Secondary | ICD-10-CM | POA: Insufficient documentation

## 2014-12-05 DIAGNOSIS — R42 Dizziness and giddiness: Secondary | ICD-10-CM | POA: Insufficient documentation

## 2014-12-05 DIAGNOSIS — Z7952 Long term (current) use of systemic steroids: Secondary | ICD-10-CM | POA: Insufficient documentation

## 2014-12-05 DIAGNOSIS — R079 Chest pain, unspecified: Secondary | ICD-10-CM | POA: Diagnosis present

## 2014-12-05 DIAGNOSIS — R519 Headache, unspecified: Secondary | ICD-10-CM

## 2014-12-05 LAB — BASIC METABOLIC PANEL
ANION GAP: 8 (ref 5–15)
BUN: 14 mg/dL (ref 6–20)
CHLORIDE: 106 mmol/L (ref 101–111)
CO2: 25 mmol/L (ref 22–32)
Calcium: 8.8 mg/dL — ABNORMAL LOW (ref 8.9–10.3)
Creatinine, Ser: 1.07 mg/dL — ABNORMAL HIGH (ref 0.44–1.00)
GFR calc Af Amer: >60 mL/min (ref >60)
GFR calc non Af Amer: >60 mL/min (ref >60)
GLUCOSE: 112 mg/dL — AB (ref 65–99)
Potassium: 3.9 mmol/L (ref 3.5–5.1)
Sodium: 139 mmol/L (ref 135–145)

## 2014-12-05 LAB — CBC WITH DIFFERENTIAL/PLATELET
Basophils Absolute: 0.1 10*3/uL (ref 0.0–0.1)
Basophils Relative: 1 % (ref 0–1)
EOS ABS: 0.6 10*3/uL (ref 0.0–0.7)
EOS PCT: 8 % — AB (ref 0–5)
HCT: 42.7 % (ref 36.0–46.0)
HEMOGLOBIN: 14.2 g/dL (ref 12.0–15.0)
Lymphocytes Relative: 28 % (ref 12–46)
Lymphs Abs: 2.2 10*3/uL (ref 0.7–4.0)
MCH: 27.6 pg (ref 26.0–34.0)
MCHC: 33.3 g/dL (ref 30.0–36.0)
MCV: 82.9 fL (ref 78.0–100.0)
Monocytes Absolute: 0.6 10*3/uL (ref 0.1–1.0)
Monocytes Relative: 7 % (ref 3–12)
Neutro Abs: 4.6 10*3/uL (ref 1.7–7.7)
Neutrophils Relative %: 56 % (ref 43–77)
Platelets: 203 10*3/uL (ref 150–400)
RBC: 5.15 MIL/uL — AB (ref 3.87–5.11)
RDW: 15 % (ref 11.5–15.5)
WBC: 8.1 10*3/uL (ref 4.0–10.5)

## 2014-12-05 LAB — TROPONIN I: Troponin I: <0.03 ng/mL (ref <0.031)

## 2014-12-05 MED ORDER — SODIUM CHLORIDE 0.9 % IV BOLUS (SEPSIS): 1000.0000 mL | Freq: Once | INTRAVENOUS | Status: DC

## 2014-12-05 MED ORDER — DIPHENHYDRAMINE HCL 50 MG/ML IJ SOLN
25.0000 mg | Freq: Once | INTRAMUSCULAR | Status: DC
Start: 2014-12-05 — End: 2014-12-05

## 2014-12-05 MED ORDER — METOCLOPRAMIDE HCL 5 MG/ML IJ SOLN
10.0000 mg | Freq: Once | INTRAMUSCULAR | Status: DC
Start: 2014-12-05 — End: 2014-12-05

## 2014-12-05 MED ORDER — DEXAMETHASONE SODIUM PHOSPHATE 10 MG/ML IJ SOLN: 10.0000 mg | Freq: Once | INTRAMUSCULAR | Status: DC

## 2014-12-05 NOTE — ED Notes (Signed)
Pt states that she has been under a lot of stress lately, she also states that her chest pain started a few hours ago while she was "doing nothing". Pt is also complaining of a headache that started "before the chest pain".

## 2014-12-05 NOTE — ED Provider Notes (Signed)
CSN: [id number]     Arrival date & time 12/05/14  0226 History   This chart was scribed for Blake Divine, MD by Arlan Organ, ED Scribe. This patient was seen in room A03C/A03C and the patient's care was started 3:05 AM.   Chief Complaint  Patient presents with  . Chest Pain   The history is provided by the patient. No language interpreter was used.    HPI Comments: TEIGHAN AUBERT is a 44 y.o. female without any pertinent past medical history who presents to the Emergency Department complaining of a worsening "pounding" frontal HA, R sided posterior neck tightness, and lightheadedness.  She has had associated intermittent, resolving chest pain x 2-3 hours after going to bed this evening. HA is described as throbbing and worsened with bright lights and loud sounds. Consistent with prior migraine type headaches. No alleviating factors at this time. No OTC medications or home remedies attempted prior to arrival. no recent fever or chills. Ms. Curbow attributes symptoms to recent ongoing stress. States she typically experiences the above symptoms when she is under a large amount of stress.  No known allergies to medications.   Past Medical History  Diagnosis Date  . Diabetes mellitus without complication    Past Surgical History  Procedure Laterality Date  . No past surgeries    . Abdominal hysterectomy     No family history on file. History  Substance Use Topics  . Smoking status: Current Every Day Smoker -- 0.50 packs/day    Types: Cigarettes  . Smokeless tobacco: Never Used  . Alcohol Use: Yes   OB History    No data available     Review of Systems  Constitutional: Negative for fever and chills.  Eyes: Positive for photophobia.  Cardiovascular: Positive for chest pain.  Gastrointestinal: Negative for nausea and vomiting.  Musculoskeletal: Positive for neck pain.  Neurological: Positive for light-headedness and headaches.  Psychiatric/Behavioral: Negative for  confusion.  All other systems reviewed and are negative.     Allergies  Review of patient's allergies indicates no known allergies.  Home Medications   Prior to Admission medications   Medication Sig Start Date End Date Taking? Authorizing Provider  albuterol (PROVENTIL HFA;VENTOLIN HFA) 108 (90 BASE) MCG/ACT inhaler Inhale 2 puffs into the lungs every 2 (two) hours as needed for wheezing or shortness of breath (cough). 12/25/13   Mercedes Camprubi-Soms, PA-C  dextromethorphan (DELSYM) 30 MG/5ML liquid Take 60 mg by mouth as needed for cough.    Historical Provider, MD  DM-Phenylephrine-Acetaminophen (ALKA-SELTZER PLS SINUS & COUGH) 10-5-325 MG CAPS Take 2 capsules by mouth daily as needed (for cold).    Historical Provider, MD  guaifenesin (ROBITUSSIN) 100 MG/5ML syrup Take 10 mLs (200 mg total) by mouth every 4 (four) hours as needed for cough or congestion. 12/25/13   Mercedes Camprubi-Soms, PA-C  HYDROcodone-acetaminophen (NORCO) 5-325 MG per tablet Take 1-2 tablets by mouth every 6 (six) hours as needed. 08/26/14   Geoffery Lyons, MD  predniSONE (DELTASONE) 20 MG tablet 3 tabs po daily x 3 days. TAKE WITH BREAKFAST 12/25/13   Mercedes Camprubi-Soms, PA-C  Spacer/Aero-Holding Chambers (AEROCHAMBER PLUS WITH MASK) inhaler Use as instructed 12/25/13   Mercedes Camprubi-Soms, PA-C   Triage Vitals: BP 156/86 mmHg  Pulse 78  Temp(Src) 97.9 F (36.6 C)  Resp 22  Ht 5\' 5"  (1.651 m)  SpO2 97%  LMP 11/27/2010   Physical Exam  Constitutional: She is oriented to person, place, and time. She appears well-developed and  well-nourished. No distress.  HENT:  Head: Normocephalic and atraumatic.  Mouth/Throat: Oropharynx is clear and moist.  Eyes: Conjunctivae are normal. Pupils are equal, round, and reactive to light. No scleral icterus.  Neck: Neck supple.  Cardiovascular: Normal rate, regular rhythm, normal heart sounds and intact distal pulses.   No murmur heard. Pulmonary/Chest: Effort normal  and breath sounds normal. No stridor. No respiratory distress. She has no rales.  Abdominal: Soft. Bowel sounds are normal. She exhibits no distension. There is no tenderness.  Musculoskeletal: Normal range of motion.  Neurological: She is alert and oriented to person, place, and time. She has normal strength. No cranial nerve deficit or sensory deficit. Coordination normal. GCS eye subscore is 4. GCS verbal subscore is 5. GCS motor subscore is 6.  Skin: Skin is warm and dry. No rash noted.  Psychiatric: She has a normal mood and affect. Her behavior is normal.  Nursing note and vitals reviewed.   ED Course  Procedures (including critical care time)  DIAGNOSTIC STUDIES: Oxygen Saturation is 97% on RA, adequate by my interpretation.    COORDINATION OF CARE: 3:13 AM- Will give fluids, Reglan, Decadron, and Benadryl. Will order CBC, BMP, urinalysis, EKG, Troponin I, and urine pregnancy. Discussed treatment plan with pt at bedside and pt agreed to plan.     Labs Review Labs Reviewed  CBC WITH DIFFERENTIAL/PLATELET - Abnormal; Notable for the following:    RBC 5.15 (*)    Eosinophils Relative 8 (*)    All other components within normal limits  BASIC METABOLIC PANEL - Abnormal; Notable for the following:    Glucose, Bld 112 (*)    Creatinine, Ser 1.07 (*)    Calcium 8.8 (*)    All other components within normal limits  TROPONIN I  URINALYSIS, ROUTINE W REFLEX MICROSCOPIC (NOT AT [facility])  POC URINE PREG, ED    Imaging Review No results found.   EKG Interpretation   Date/Time:  Thursday December 05 2014 02:30:24 EDT Ventricular Rate:  76 PR Interval:  152 QRS Duration: 80 QT Interval:  386 QTC Calculation: 434 R Axis:   -6 Text Interpretation:  Normal sinus rhythm Normal ECG No significant change  was found Confirmed by [facility]  MD, TREY ([id number]) on 12/05/2014 3:57:10 AM      MDM   Final diagnoses:  Nonintractable headache, unspecified chronicity pattern, unspecified headache  type    44 yo female presenting with headache.  Similar to prior.  Gradual onset.  No fevers, no meningismus.  HA not c/w meningitis or SAH.    CP is associated with HA.  Described as resolving and consistent with tightness she feels when she is stressed.  Atypical for ACS.    Treat HA with IV metoclopramide, diphenhydramine, dexamethasone, and fluids.   5:07 AM Pt ultimately declined an IV and any medications.  She eloped from the ED prior to my re-evaluation.  I have a very low suspicion for meningitis, SAH, ACS, PE, or dissection.  She mentioned to a nurse that she would follow up with her PCP.    I personally performed the services described in this documentation, which was scribed in my presence. The recorded information has been reviewed and is accurate.     Blake Divine, MD 12/05/14 [phone]

## 2014-12-05 NOTE — ED Notes (Signed)
Pt refusing IV, IV fluids, and IV medications. Pt is requesting to have medications by mouth and would like some tylenol,

## 2014-12-05 NOTE — ED Notes (Signed)
The pt is c/o mid-chest pain with a headache  For 2 hours.  No cardiac history.  Pt keeping her eyes closed and does not look at anyone while being triaged  lmp none

## 2014-12-05 NOTE — ED Notes (Signed)
Pt. States she is feeling better and just wanted to leave. This RN tried to find MD and inform of patient's plan to leave. This RN reviewed with patient that she should return if the pain returns or she can follow up with her primary care provider. Pt. Verbalized understanding and refused wheelchair. Pt left with all belongings

## 2015-11-28 ENCOUNTER — Emergency Department (HOSPITAL_COMMUNITY)
Admission: EM | Admit: 2015-11-28 | Discharge: 2015-11-28 | Disposition: A | Discharge status: Home / Self Care | Payer: 59 | Attending: Emergency Medicine | Admitting: Emergency Medicine

## 2015-11-28 ENCOUNTER — Encounter (HOSPITAL_COMMUNITY): Payer: Self-pay

## 2015-11-28 DIAGNOSIS — E119 Type 2 diabetes mellitus without complications: Secondary | ICD-10-CM | POA: Insufficient documentation

## 2015-11-28 DIAGNOSIS — R112 Nausea with vomiting, unspecified: Secondary | ICD-10-CM

## 2015-11-28 DIAGNOSIS — F17210 Nicotine dependence, cigarettes, uncomplicated: Secondary | ICD-10-CM | POA: Insufficient documentation

## 2015-11-28 DIAGNOSIS — N390 Urinary tract infection, site not specified: Secondary | ICD-10-CM | POA: Insufficient documentation

## 2015-11-28 DIAGNOSIS — R197 Diarrhea, unspecified: Secondary | ICD-10-CM | POA: Insufficient documentation

## 2015-11-28 LAB — COMPREHENSIVE METABOLIC PANEL
ALBUMIN: 4.1 g/dL (ref 3.5–5.0)
ALT: 12 U/L — ABNORMAL LOW (ref 14–54)
ANION GAP: 6 (ref 5–15)
AST: 17 U/L (ref 15–41)
Alkaline Phosphatase: 69 U/L (ref 38–126)
BUN: 11 mg/dL (ref 6–20)
CALCIUM: 9 mg/dL (ref 8.9–10.3)
CO2: 27 mmol/L (ref 22–32)
Chloride: 103 mmol/L (ref 101–111)
Creatinine, Ser: 0.88 mg/dL (ref 0.44–1.00)
GFR calc Af Amer: >60 mL/min (ref >60)
GFR calc non Af Amer: >60 mL/min (ref >60)
GLUCOSE: 106 mg/dL — AB (ref 65–99)
Potassium: 3.9 mmol/L (ref 3.5–5.1)
Sodium: 136 mmol/L (ref 135–145)
Total Bilirubin: 0.6 mg/dL (ref 0.3–1.2)
Total Protein: 7.2 g/dL (ref 6.5–8.1)

## 2015-11-28 LAB — CBC
HCT: 45.2 % (ref 36.0–46.0)
HEMOGLOBIN: 15.3 g/dL — AB (ref 12.0–15.0)
MCH: 28.1 pg (ref 26.0–34.0)
MCHC: 33.8 g/dL (ref 30.0–36.0)
MCV: 83.1 fL (ref 78.0–100.0)
PLATELETS: 194 10*3/uL (ref 150–400)
RBC: 5.44 MIL/uL — ABNORMAL HIGH (ref 3.87–5.11)
RDW: 14.6 % (ref 11.5–15.5)
WBC: 8.6 10*3/uL (ref 4.0–10.5)

## 2015-11-28 LAB — URINALYSIS, ROUTINE W REFLEX MICROSCOPIC
BILIRUBIN URINE: NEGATIVE
Glucose, UA: NEGATIVE mg/dL
Hgb urine dipstick: NEGATIVE
KETONES UR: NEGATIVE mg/dL
NITRITE: NEGATIVE
PROTEIN: NEGATIVE mg/dL
SPECIFIC GRAVITY, URINE: 1.025 (ref 1.005–1.030)
pH: 6 (ref 5.0–8.0)

## 2015-11-28 LAB — URINE MICROSCOPIC-ADD ON

## 2015-11-28 LAB — LIPASE, BLOOD: Lipase: 21 U/L (ref 11–51)

## 2015-11-28 MED ORDER — FOSFOMYCIN TROMETHAMINE 3 G PO PACK
3.0000 g | PACK | Freq: Once | ORAL | Status: AC
  Administered 2015-11-28: 3 g via ORAL
  Filled 2015-11-28: qty 3

## 2015-11-28 MED ORDER — ONDANSETRON 4 MG PO TBDP: ORAL_TABLET | ORAL | 0 refills | Status: DC

## 2015-11-28 MED ORDER — SODIUM CHLORIDE 0.9 % IV BOLUS (SEPSIS)
1000.0000 mL | Freq: Once | INTRAVENOUS | Status: AC
Start: 2015-11-28 — End: 2015-11-28
  Administered 2015-11-28: 1000 mL via INTRAVENOUS

## 2015-11-28 MED ORDER — ONDANSETRON HCL 4 MG/2ML IJ SOLN
4.0000 mg | Freq: Once | INTRAMUSCULAR | Status: AC
  Administered 2015-11-28: 4 mg via INTRAVENOUS
  Filled 2015-11-28: qty 2

## 2015-11-28 NOTE — Discharge Instructions (Signed)
Take imodium OTC

## 2015-11-28 NOTE — ED Triage Notes (Signed)
Pt with n/v/d and chills since yesterday.  Unknown for fever.  Not taken any meds. Just drinking gingerale.

## 2015-11-28 NOTE — ED Provider Notes (Signed)
[facility]-EMERGENCY DEPT Provider Note   CSN: [id number] Arrival date & time: 11/28/15  1422  First Provider Contact:  First MD Initiated Contact with Patient 11/28/15 1756        History   Chief Complaint Chief Complaint  Patient presents with  . Emesis  . Diarrhea    HPI April [name] is a 45 y.o. female.  45 yo F with a chief complaint of nausea vomiting diarrhea. The started yesterday. Patient has had multiple episodes today as well. Denies bilious or bloody emesis denies dark or bloody diarrhea. She denies fevers or chills denies abdominal pain. She is unsure about sick contacts.   The history is provided by the patient.  Emesis   This is a new problem. The current episode started less than 1 hour ago. The problem occurs constantly. The problem has not changed since onset.Her temperature was unmeasured prior to arrival. The temperature was taken using an oral thermometer. Associated symptoms include diarrhea and vomiting. Pertinent negatives include no chest pain, no congestion and no headaches. She has tried nothing for the symptoms. The treatment provided no relief.  Diarrhea      Past Medical History:  Diagnosis Date  . Diabetes mellitus without complication [facility])     Patient Active Problem List   Diagnosis Date Noted  . Status post total hysterectomy 12/01/2010  . Pelvic peritoneal adhesions, female 12/01/2010  . Hydrosalpinx 12/01/2010    Past Surgical History:  Procedure Laterality Date  . ABDOMINAL HYSTERECTOMY    . NO PAST SURGERIES      OB History    No data available       Home Medications    Prior to Admission medications   Medication Sig Start Date End Date Taking? Authorizing Provider  ondansetron (ZOFRAN ODT) 4 MG disintegrating tablet  ODT q4 hours prn nausea/vomit 11/28/15   Melene Plan, DO    Family History History reviewed. No pertinent family history.  Social History Social History  Substance Use Topics  . Smoking  status: Current Every Day Smoker    Packs/day: 0.50    Types: Cigarettes  . Smokeless tobacco: Never Used  . Alcohol use Yes     Allergies   Review of patient's allergies indicates no known allergies.   Review of Systems Review of Systems  Constitutional: Negative for chills and fever.  HENT: Negative for congestion and rhinorrhea.   Eyes: Negative for redness and visual disturbance.  Respiratory: Negative for shortness of breath and wheezing.   Cardiovascular: Negative for chest pain and palpitations.  Gastrointestinal: Positive for diarrhea, nausea and vomiting.  Genitourinary: Negative for dysuria and urgency.  Musculoskeletal: Negative for arthralgias and myalgias.  Skin: Negative for pallor and wound.  Neurological: Negative for dizziness and headaches.     Physical Exam Updated Vital Signs BP 136/60 (BP Location: Right Arm)   Pulse 78   Temp 98.7 F (37.1 C) (Oral)   Resp 20   LMP 11/27/2010   SpO2 97%   Physical Exam  Constitutional: She is oriented to person, place, and time. She appears well-developed and well-nourished. No distress.  HENT:  Head: Normocephalic and atraumatic.  Eyes: EOM are normal. Pupils are equal, round, and reactive to light.  Neck: Normal range of motion. Neck supple.  Cardiovascular: Normal rate and regular rhythm.  Exam reveals no gallop and no friction rub.   No murmur heard. Pulmonary/Chest: Effort normal. She has no wheezes. She has no rales.  Abdominal: Soft. She exhibits no distension  and no mass. There is no tenderness. There is no guarding.  Musculoskeletal: She exhibits no edema or tenderness.  Neurological: She is alert and oriented to person, place, and time.  Skin: Skin is warm and dry. She is not diaphoretic.  Psychiatric: She has a normal mood and affect. Her behavior is normal.  Nursing note and vitals reviewed.    ED Treatments / Results  Labs (all labs ordered are listed, but only abnormal results are  displayed) Labs Reviewed  COMPREHENSIVE METABOLIC PANEL - Abnormal; Notable for the following:       Result Value   Glucose, Bld 106 (*)    ALT 12 (*)    All other components within normal limits  CBC - Abnormal; Notable for the following:    RBC 5.44 (*)    Hemoglobin 15.3 (*)    All other components within normal limits  URINALYSIS, ROUTINE W REFLEX MICROSCOPIC (NOT AT [facility]) - Abnormal; Notable for the following:    APPearance CLOUDY (*)    Leukocytes, UA MODERATE (*)    All other components within normal limits  URINE MICROSCOPIC-ADD ON - Abnormal; Notable for the following:    Squamous Epithelial / LPF 6-30 (*)    Bacteria, UA FEW (*)    All other components within normal limits  LIPASE, BLOOD    EKG  EKG Interpretation None       Radiology No results found.  Procedures Procedures (including critical care time)  Medications Ordered in ED Medications  sodium chloride 0.9 % bolus 1,000 mL (1,000 mLs Intravenous New Bag/Given 11/28/15 1741)  ondansetron (ZOFRAN) injection 4 mg (4 mg Intravenous Given 11/28/15 1741)  fosfomycin (MONUROL) packet 3 g (3 g Oral Given 11/28/15 1829)     Initial Impression / Assessment and Plan / ED Course  I have reviewed the triage vital signs and the nursing notes.  Pertinent labs & imaging results that were available during my care of the patient were reviewed by me and considered in my medical decision making (see chart for details).  Clinical Course    45 yo F With nausea vomiting and diarrhea. I suspect likely viral based on presentation. No noted abdominal tenderness. Given IV fluid Zofran able to tolerate by mouth. Also found to have a urinary tract infection on UA. Treat with fosfomycin. Discharge home.  6:46 PM:  I have discussed the diagnosis/risks/treatment options with the patient and believe the pt to be eligible for discharge home to follow-up with PCP. We also discussed returning to the ED immediately if new or worsening  sx occur. We discussed the sx which are most concerning (e.g., sudden worsening pain, fever, inability to tolerate by mouth) that necessitate immediate return. Medications administered to the patient during their visit and any new prescriptions provided to the patient are listed below.  Medications given during this visit Medications  sodium chloride 0.9 % bolus 1,000 mL (1,000 mLs Intravenous New Bag/Given 11/28/15 1741)  ondansetron (ZOFRAN) injection 4 mg (4 mg Intravenous Given 11/28/15 1741)  fosfomycin (MONUROL) packet 3 g (3 g Oral Given 11/28/15 1829)     The patient appears reasonably screen and/or stabilized for discharge and I doubt any other medical condition or other [facility] requiring further screening, evaluation, or treatment in the ED at this time prior to discharge.    Final Clinical Impressions(s) / ED Diagnoses   Final diagnoses:  UTI (lower urinary tract infection)  Nausea vomiting and diarrhea    New Prescriptions New Prescriptions  ONDANSETRON (ZOFRAN ODT) 4 MG DISINTEGRATING TABLET    4mg  ODT q4 hours prn nausea/vomit     Melene Plan, DO 11/28/15 1846

## 2016-11-02 ENCOUNTER — Encounter (HOSPITAL_COMMUNITY): Payer: Self-pay | Admitting: *Deleted

## 2016-11-02 ENCOUNTER — Ambulatory Visit (HOSPITAL_COMMUNITY)
Admission: EM | Admit: 2016-11-02 | Discharge: 2016-11-02 | Disposition: A | Discharge status: Home / Self Care | Payer: 59 | Attending: Internal Medicine | Admitting: Internal Medicine

## 2016-11-02 DIAGNOSIS — A084 Viral intestinal infection, unspecified: Secondary | ICD-10-CM

## 2016-11-02 DIAGNOSIS — R110 Nausea: Secondary | ICD-10-CM

## 2016-11-02 MED ORDER — ONDANSETRON 4 MG PO TBDP: 4.0000 mg | ORAL_TABLET | Freq: Three times a day (TID) | ORAL | 0 refills | Status: DC | PRN

## 2016-11-02 MED ORDER — ONDANSETRON 4 MG PO TBDP
4.0000 mg | ORAL_TABLET | Freq: Once | ORAL | Status: AC
  Administered 2016-11-02: 4 mg via ORAL

## 2016-11-02 MED ORDER — ONDANSETRON 4 MG PO TBDP
ORAL_TABLET | ORAL | Status: AC
  Filled 2016-11-02: qty 1

## 2016-11-02 MED ORDER — ACETAMINOPHEN 325 MG PO TABS
ORAL_TABLET | ORAL | Status: AC
  Filled 2016-11-02: qty 2

## 2016-11-02 MED ORDER — ACETAMINOPHEN 325 MG PO TABS
650.0000 mg | ORAL_TABLET | Freq: Once | ORAL | Status: AC
  Administered 2016-11-02: 650 mg via ORAL

## 2016-11-02 NOTE — ED Triage Notes (Signed)
Nausea    Diarrhea   And  Chills    started  Jabil CircuitYesterday

## 2016-11-02 NOTE — ED Provider Notes (Signed)
CSN: [id number]     Arrival date & time 11/02/16  1143 History   None    Chief Complaint  Patient presents with  . Nausea   (Consider location/radiation/quality/duration/timing/severity/associated sxs/prior Treatment) Patient c/o NVD x 2 days   The history is provided by the patient.  Diarrhea  Quality:  Watery Severity:  Moderate Onset quality:  Sudden Duration:  2 days Progression:  Unchanged Relieved by:  Nothing Worsened by:  Nothing Ineffective treatments:  None tried Associated symptoms: arthralgias and vomiting     Past Medical History:  Diagnosis Date  . Diabetes mellitus without complication [facility](HCC)    Past Surgical History:  Procedure Laterality Date  . ABDOMINAL HYSTERECTOMY    . NO PAST SURGERIES     History reviewed. No pertinent family history. Social History  Substance Use Topics  . Smoking status: Current Every Day Smoker    Packs/day: 0.50    Types: Cigarettes  . Smokeless tobacco: Never Used  . Alcohol use Yes   OB History    No data available     Review of Systems  Constitutional: Positive for fatigue.  HENT: Negative.   Eyes: Negative.   Respiratory: Negative.   Cardiovascular: Negative.   Gastrointestinal: Positive for diarrhea and vomiting.  Endocrine: Negative.   Genitourinary: Negative.   Musculoskeletal: Positive for arthralgias.  Allergic/Immunologic: Negative.   Neurological: Negative.   Hematological: Negative.   Psychiatric/Behavioral: Negative.     Allergies  Patient has no known allergies.  Home Medications   Prior to Admission medications   Medication Sig Start Date End Date Taking? Authorizing Provider  ondansetron (ZOFRAN ODT) 4 MG disintegrating tablet 4mg  ODT q4 hours prn nausea/vomit 11/28/15   Melene PlanFloyd, Dan, DO  ondansetron (ZOFRAN ODT) 4 MG disintegrating tablet Take 1 tablet (4 mg total) by mouth every 8 (eight) hours as needed for nausea or vomiting. 11/02/16   Deatra Canterxford, Winter Trefz J, FNP   Meds Ordered and  Administered this Visit   Medications  acetaminophen (TYLENOL) tablet 650 mg (650 mg Oral Given 11/02/16 1258)  ondansetron (ZOFRAN-ODT) disintegrating tablet 4 mg (4 mg Oral Given 11/02/16 1258)    BP 126/66 (BP Location: Left Arm)   Pulse 80   Temp 98.7 F (37.1 C) (Oral)   Resp 16   LMP 11/29/2010   SpO2 100%  No data found.   Physical Exam  Constitutional: She is oriented to person, place, and time. She appears well-developed and well-nourished.  HENT:  Head: Normocephalic and atraumatic.  Right Ear: External ear normal.  Left Ear: External ear normal.  Eyes: Conjunctivae and EOM are normal. Pupils are equal, round, and reactive to light.  Neck: Normal range of motion. Neck supple.  Cardiovascular: Normal rate, regular rhythm and normal heart sounds.   Pulmonary/Chest: Effort normal and breath sounds normal.  Abdominal: Soft. Bowel sounds are normal.  Neurological: She is alert and oriented to person, place, and time.  Nursing note and vitals reviewed.   Urgent Care Course     Procedures (including critical care time)  Labs Review Labs Reviewed - No data to display  Imaging Review No results found.   Visual Acuity Review  Right Eye Distance:   Left Eye Distance:   Bilateral Distance:    Right Eye Near:   Left Eye Near:    Bilateral Near:         MDM   1. Viral gastroenteritis   2. Nausea    Zofran ODT 4mg  now Zofran ODT 4mg  on  po tid prn #21  Push po fluids, rest, tylenol and motrin otc prn as directed for fever, arthralgias, and myalgias.  Follow up prn if sx's continue or persist.    Deatra Canter, FNP 11/02/16 1344

## 2017-01-26 IMAGING — CT CT ABD-PELV W/ CM
2 of 5 series · 16 of 46 positions shown, 18 images · IV contrast (APPLIED)
Comparison: Pelvic ultrasound 08/26/2014. Abdominal pelvic CT
08/21/2012.

CLINICAL DATA: Left lower quadrant abdominal pain for 3 months.
History of left ovarian cyst and partial hysterectomy. Initial
encounter. BUN and creatinine were obtained on site at [HOSPITAL]
[HOSPITAL] [HOSPITAL].Results: BUN 17 mg/dL, Creatinine
mg/dL.

EXAM:
CT ABDOMEN AND PELVIS WITH CONTRAST
TECHNIQUE: Multidetector CT imaging of the abdomen and pelvis was performed
using the standard protocol following bolus administration of
intravenous contrast.
CONTRAST:  125mL AIB3S8-ORR IOPAMIDOL (AIB3S8-ORR) INJECTION 61%

[Series 2: abd pelvis 5.0 i41s 1 · axial · 0.98mm/px · z∈[-430,-0]mm · 13 of 98 slices shown, 15 images]
[im 6/98  soft-tissue]
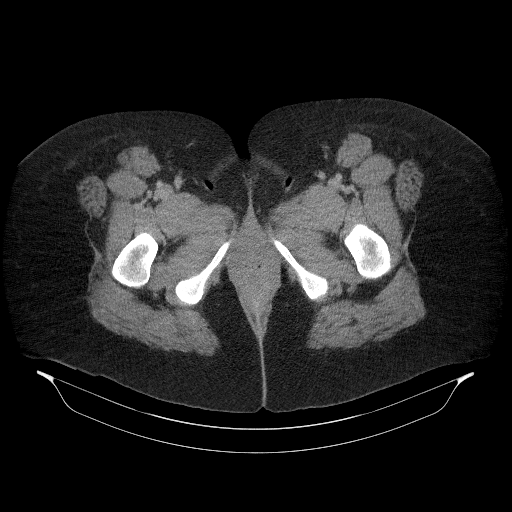
[im 6/98  bone]
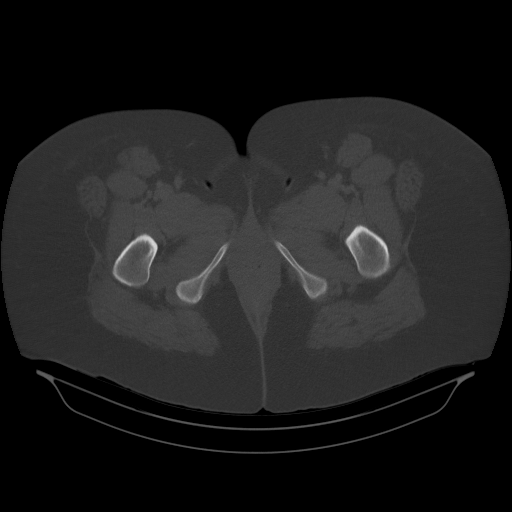
[im 16/98  soft-tissue]
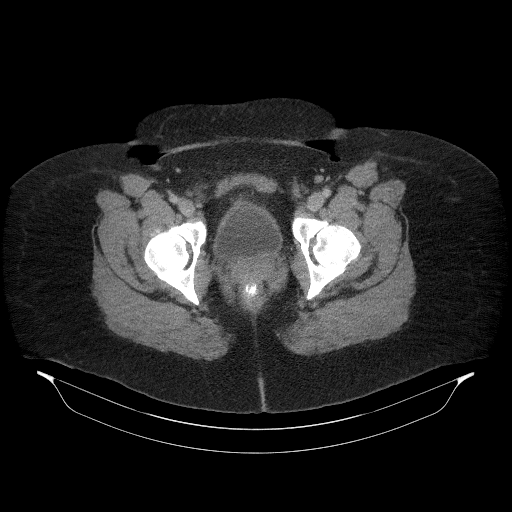
[im 21/98  soft-tissue]
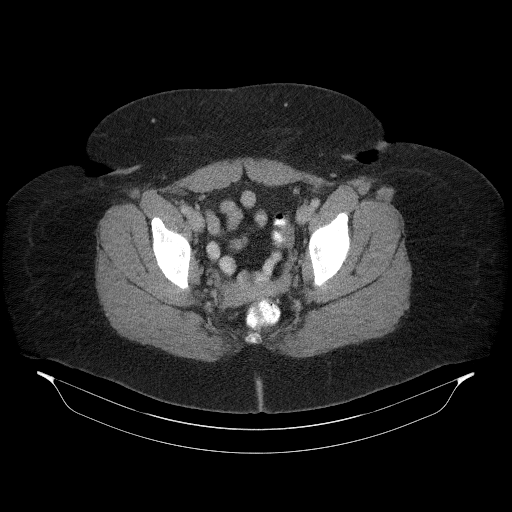
[im 26/98  soft-tissue]
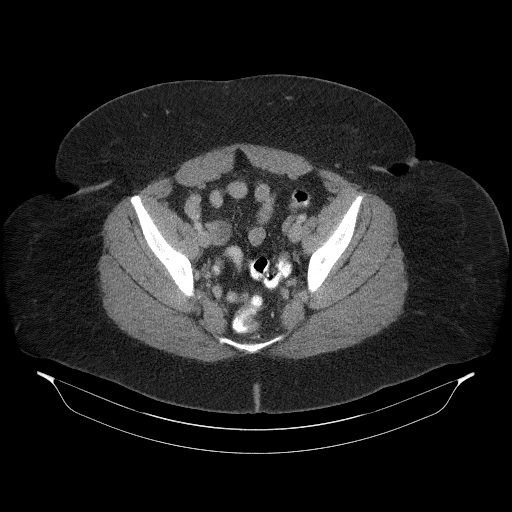
[im 36/98  soft-tissue]
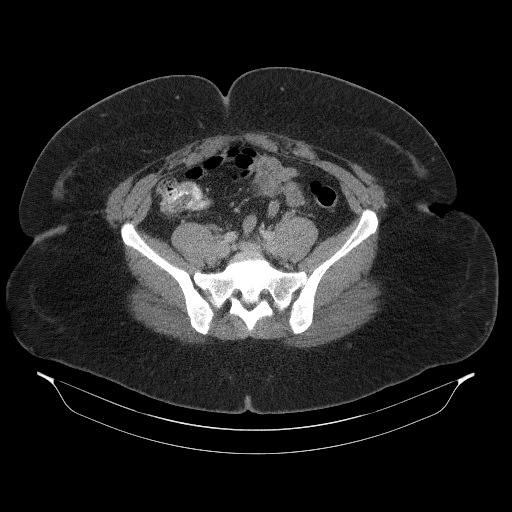
[im 41/98  soft-tissue]
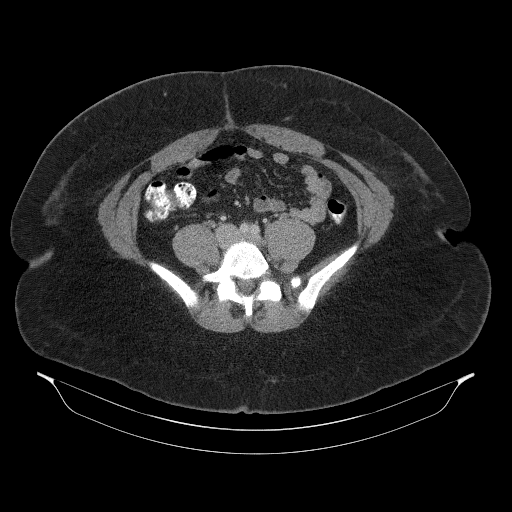
[im 52/98  soft-tissue]
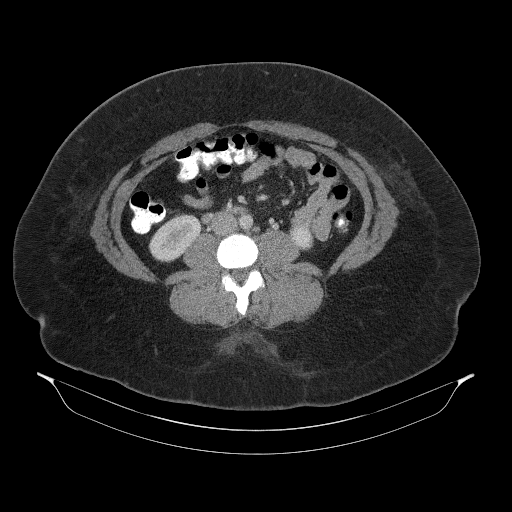
[im 57/98  soft-tissue]
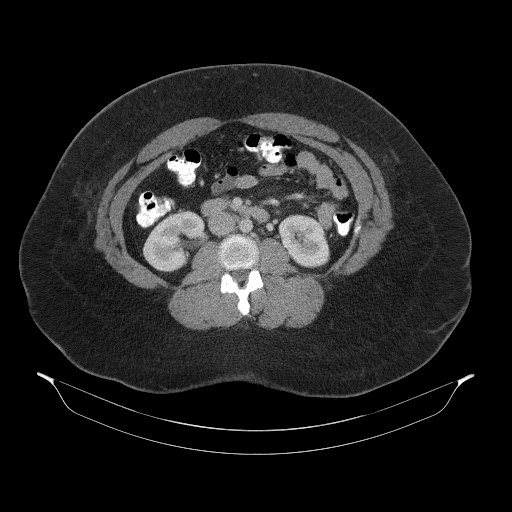
[im 62/98  soft-tissue]
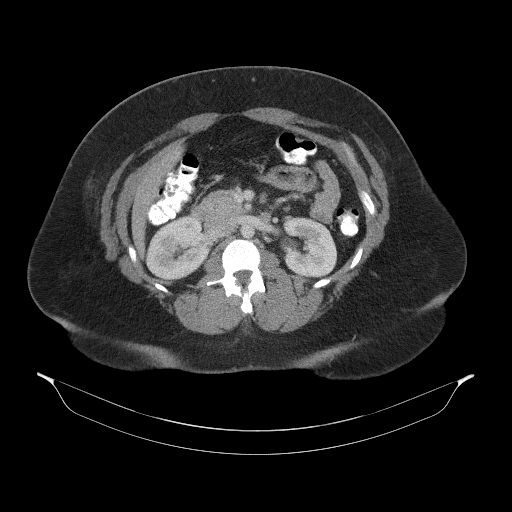
[im 62/98  bone]
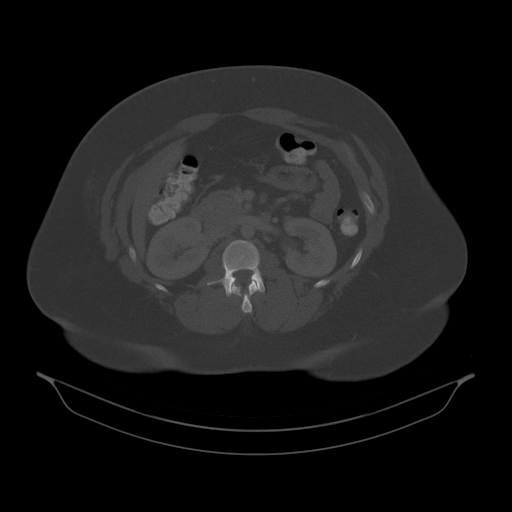
[im 72/98  soft-tissue]
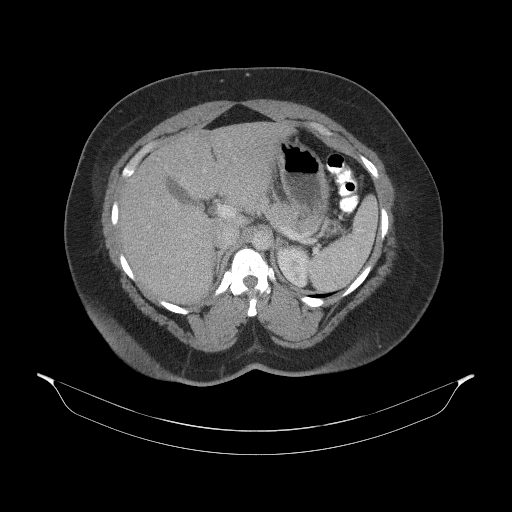
[im 77/98  soft-tissue]
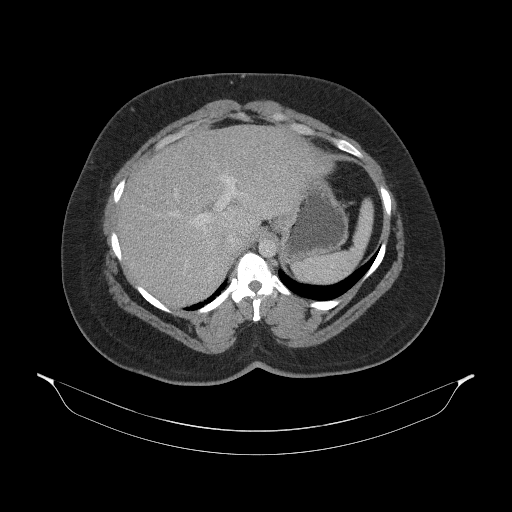
[im 82/98  soft-tissue]
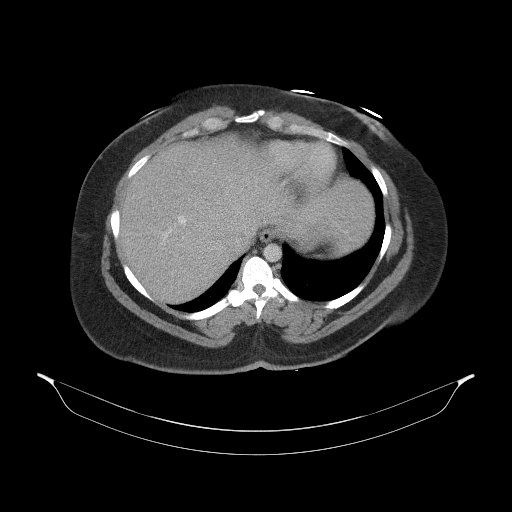
[im 92/98  soft-tissue]
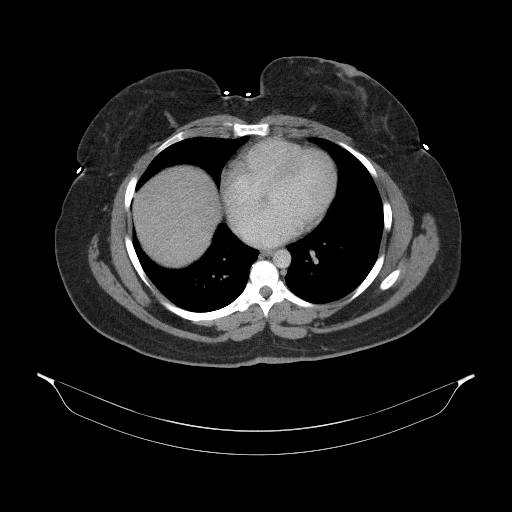

[Series 3: abd pelvis 3.0 spo cor · coronal · 0.94mm/px · 3 of 92 slices shown]
[im 31/92  soft-tissue]
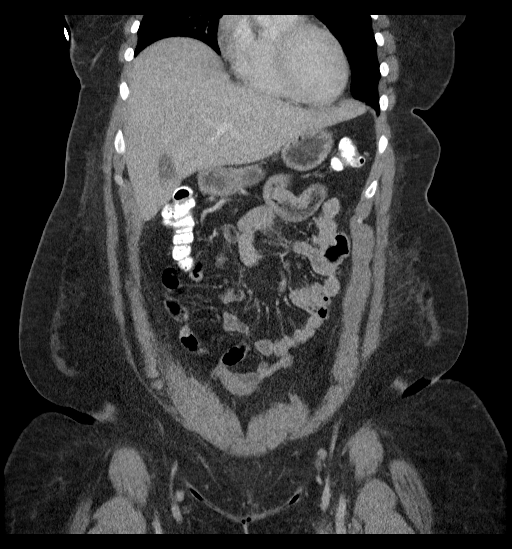
[im 41/92  soft-tissue]
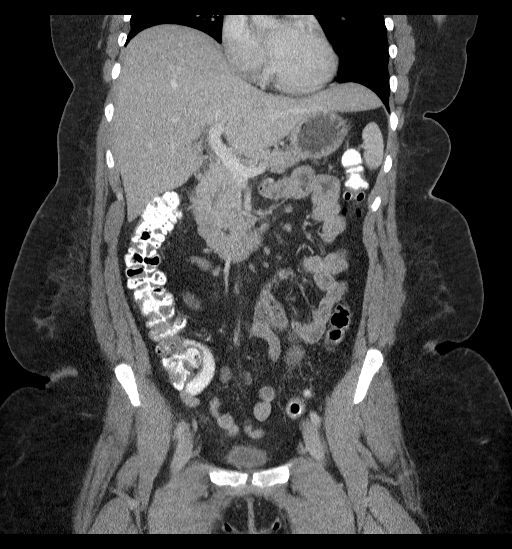
[im 51/92  soft-tissue]
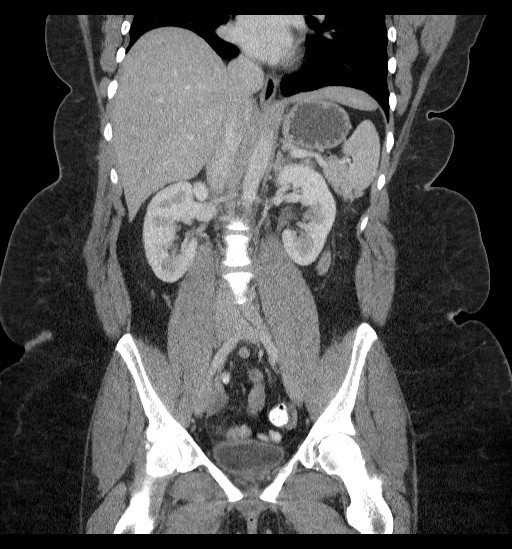

[16 of 46 positions shown; findings below may reference images not displayed]

FINDINGS: Lower chest: Clear lung bases. No significant pleural or pericardial
effusion.

Hepatobiliary: The liver is normal in density without focal
abnormality. The gallbladder appears mostly intrahepatic and mildly
compressed by the adjacent liver. No evidence of gallstones, wall
thickening or biliary dilatation.

Pancreas: Unremarkable. No pancreatic ductal dilatation or
surrounding inflammatory changes.

Spleen: Normal in size without focal abnormality.

Adrenals/Urinary Tract: Both adrenal glands appear normal.The
kidneys appear normal without evidence of urinary tract calculus,
suspicious lesion or hydronephrosis. No bladder abnormalities are
seen.

Stomach/Bowel: No evidence of bowel wall thickening, distention or
surrounding inflammatory change.Retrocecal appendix appears normal.

Vascular/Lymphatic: There are no enlarged abdominal or pelvic lymph
nodes. No significant vascular findings are present.

Reproductive: Status post partial hysterectomy. Both ovaries are
visualized. There is a small right-sided follicle, measuring 1.9 cm
on coronal image number 51. The left ovary appears normal. No
evidence of adnexal mass.

Other: No evidence of abdominal wall mass or hernia.

Musculoskeletal: No acute or significant osseous findings. Mild
convex right thoracolumbar scoliosis.
IMPRESSION: 1. No acute findings or explanation for left lower quadrant pain.
2. Both ovaries appear normal status post partial hysterectomy. No
adnexal mass.

## 2017-05-09 ENCOUNTER — Encounter (HOSPITAL_COMMUNITY): Payer: Self-pay | Admitting: Emergency Medicine

## 2017-05-09 ENCOUNTER — Other Ambulatory Visit: Payer: Self-pay

## 2017-05-09 ENCOUNTER — Ambulatory Visit (HOSPITAL_COMMUNITY)
Admission: EM | Admit: 2017-05-09 | Discharge: 2017-05-09 | Disposition: A | Discharge status: Home / Self Care | Payer: 59 | Attending: Family Medicine | Admitting: Family Medicine

## 2017-05-09 DIAGNOSIS — R6889 Other general symptoms and signs: Secondary | ICD-10-CM

## 2017-05-09 MED ORDER — ONDANSETRON HCL 4 MG PO TABS: 4.0000 mg | ORAL_TABLET | Freq: Four times a day (QID) | ORAL | 0 refills | Status: DC

## 2017-05-09 NOTE — ED Triage Notes (Signed)
Onset today of symptoms.  Symptoms include diarrhea, headache, chills, body aches, vomiting initially.  2 episodes of vomiting, 3 episodes of diarrhea

## 2017-05-09 NOTE — ED Provider Notes (Signed)
[facility]    CSN: [id number] Arrival date & time: 05/09/17  1821     History   Chief Complaint Chief Complaint  Patient presents with  . URI    HPI April [name] is a 47 y.o. female.   47 yo female here for fever, nausea, vomiting, diarrhea, and feeling weak x 1 day. Known sick contact at work with the flu. Has tried alka seltzer cold with little relief. Nothing makes worse. Rates severity of symptoms 8/10.      Past Medical History:  Diagnosis Date  . Diabetes mellitus without complication [facility])     Patient Active Problem List   Diagnosis Date Noted  . Status post total hysterectomy 12/01/2010  . Pelvic peritoneal adhesions, female 12/01/2010  . Hydrosalpinx 12/01/2010    Past Surgical History:  Procedure Laterality Date  . ABDOMINAL HYSTERECTOMY    . NO PAST SURGERIES      OB History    No data available       Home Medications    Prior to Admission medications   Medication Sig Start Date End Date Taking? Authorizing Provider  ondansetron (ZOFRAN) 4 MG tablet Take 1 tablet (4 mg total) by mouth every 6 (six) hours. 05/09/17   Rolm Bookbinder, DO    Family History Family History  Problem Relation Age of Onset  . Diabetes Mother   . Hypertension Mother   . Hypertension Father   . Thyroid disease Father     Social History Social History   Tobacco Use  . Smoking status: Current Every Day Smoker    Packs/day: 0.50    Types: Cigarettes  . Smokeless tobacco: Never Used  Substance Use Topics  . Alcohol use: Yes  . Drug use: No     Allergies   Patient has no known allergies.   Review of Systems Review of Systems  Constitutional: Positive for fatigue and fever.  HENT: Negative for congestion and ear discharge.   Eyes: Negative for discharge and itching.  Respiratory: Negative for apnea and chest tightness.   Cardiovascular: Negative for chest pain and leg swelling.  Gastrointestinal: Positive for diarrhea, nausea and  vomiting.  Endocrine: Negative for cold intolerance and heat intolerance.  Genitourinary: Negative for difficulty urinating and dysuria.  Musculoskeletal: Negative for arthralgias and back pain.  Neurological: Negative for dizziness and headaches.  Hematological: Negative for adenopathy. Does not bruise/bleed easily.     Physical Exam Triage Vital Signs ED Triage Vitals  Enc Vitals Group     BP 05/09/17 1916 (!) 135/55     Pulse Rate 05/09/17 1916 74     Resp 05/09/17 1916 (!) 24     Temp 05/09/17 1916 98.5 F (36.9 C)     Temp Source 05/09/17 1916 Oral     SpO2 05/09/17 1916 100 %     Weight --      Height --      Head Circumference --      Peak Flow --      Pain Score 05/09/17 1914 6     Pain Loc --      Pain Edu? --      Excl. in GC? --    No data found.  Updated Vital Signs BP (!) 135/55 (BP Location: Left Arm)   Pulse 74   Temp 98.5 F (36.9 C) (Oral)   Resp (!) 24   LMP 11/29/2010   SpO2 100%   Visual Acuity Right Eye Distance:   Left  Eye Distance:   Bilateral Distance:    Right Eye Near:   Left Eye Near:    Bilateral Near:     Physical Exam  Constitutional: She is oriented to person, place, and time. She appears well-developed and well-nourished. No distress.  HENT:  Head: Normocephalic and atraumatic.  Right Ear: External ear normal.  Left Ear: External ear normal.  Mouth/Throat: Oropharynx is clear and moist. No oropharyngeal exudate.  Eyes: EOM are normal. Pupils are equal, round, and reactive to light.  Neck: Normal range of motion. Neck supple.  Cardiovascular: Normal rate and intact distal pulses.  Pulmonary/Chest: Effort normal. No respiratory distress.  Abdominal: Soft. She exhibits no distension. There is no tenderness.  Musculoskeletal: Normal range of motion. She exhibits no edema.  Neurological: She is alert and oriented to person, place, and time.  Skin: Skin is warm and dry.  Psychiatric: She has a normal mood and affect. Her  behavior is normal.     UC Treatments / Results  Labs (all labs ordered are listed, but only abnormal results are displayed) Labs Reviewed - No data to display  EKG  EKG Interpretation None       Radiology No results found.  Procedures Procedures (including critical care time)  Medications Ordered in UC Medications - No data to display   Initial Impression / Assessment and Plan / UC Course  I have reviewed the triage vital signs and the nursing notes.  Pertinent labs & imaging results that were available during my care of the patient were reviewed by me and considered in my medical decision making (see chart for details).     Viral gastroenteritis vs. Influenza. Will treat supportively with zofran. Follow up with pcp if symptoms worsen.  Final Clinical Impressions(s) / UC Diagnoses   Final diagnoses:  Flu-like symptoms    ED Discharge Orders        Ordered    ondansetron (ZOFRAN) 4 MG tablet  Every 6 hours     05/09/17 1947       Controlled Substance Prescriptions [place] Controlled Substance Registry consulted? Not Applicable   Rolm BookbinderMoss, Rayvn Rickerson, DO 05/09/17 1947

## 2017-10-03 ENCOUNTER — Encounter (HOSPITAL_COMMUNITY): Payer: Self-pay | Admitting: Emergency Medicine

## 2017-10-03 ENCOUNTER — Other Ambulatory Visit: Payer: Self-pay

## 2017-10-03 ENCOUNTER — Ambulatory Visit (HOSPITAL_COMMUNITY)
Admission: EM | Admit: 2017-10-03 | Discharge: 2017-10-03 | Disposition: A | Discharge status: Home / Self Care | Payer: 59 | Attending: Family Medicine | Admitting: Family Medicine

## 2017-10-03 DIAGNOSIS — R519 Headache, unspecified: Secondary | ICD-10-CM

## 2017-10-03 DIAGNOSIS — R030 Elevated blood-pressure reading, without diagnosis of hypertension: Secondary | ICD-10-CM

## 2017-10-03 DIAGNOSIS — R51 Headache: Secondary | ICD-10-CM

## 2017-10-03 MED ORDER — KETOROLAC TROMETHAMINE 60 MG/2ML IM SOLN
60.0000 mg | Freq: Once | INTRAMUSCULAR | Status: AC
  Administered 2017-10-03: 60 mg via INTRAMUSCULAR

## 2017-10-03 MED ORDER — KETOROLAC TROMETHAMINE 60 MG/2ML IM SOLN
INTRAMUSCULAR | Status: AC
  Filled 2017-10-03: qty 2

## 2017-10-03 NOTE — ED Provider Notes (Signed)
[facility]    CSN: [id number] Arrival date & time: 10/03/17  1047     History   Chief Complaint Chief Complaint  Patient presents with  . Hypertension    HPI April [name] is a 47 y.o. female.   April [name] presents with concerns of her BP as well as headaches. States three days ago at work felt generally unwell therefore checked her BP which was elevated to 161/104. Over the weekend her BP has remained in the 150's. This am when she woke her BP was 135/70. Still with somewhat of a "nagging" frontal headache. No chest pain , palpitations, leg swelling, dizziness. Does not have an active PCP. Not on any medications. She does smoke.   Per chart review her last three [facility] visits, last in 05/2017 has has normal BP's. Two years ago was seen in ER for headache and had BP in 150's as well.   ROS per HPI.      History reviewed. No pertinent past medical history.  Patient Active Problem List   Diagnosis Date Noted  . Status post total hysterectomy 12/01/2010  . Pelvic peritoneal adhesions, female 12/01/2010  . Hydrosalpinx 12/01/2010    Past Surgical History:  Procedure Laterality Date  . ABDOMINAL HYSTERECTOMY    . NO PAST SURGERIES      OB History   None      Home Medications    Prior to Admission medications   Medication Sig Start Date End Date Taking? Authorizing Provider  ondansetron (ZOFRAN) 4 MG tablet Take 1 tablet (4 mg total) by mouth every 6 (six) hours. 05/09/17   Rolm BookbinderMoss, Amber, DO    Family History Family History  Problem Relation Age of Onset  . Diabetes Mother   . Hypertension Mother   . Hypertension Father   . Thyroid disease Father     Social History Social History   Tobacco Use  . Smoking status: Current Every Day Smoker    Packs/day: 0.50    Types: Cigarettes  . Smokeless tobacco: Never Used  Substance Use Topics  . Alcohol use: Yes  . Drug use: No     Allergies   Patient has no known allergies.   Review of  Systems Review of Systems   Physical Exam Triage Vital Signs ED Triage Vitals  Enc Vitals Group     BP 10/03/17 1144 (!) 154/91     Pulse Rate 10/03/17 1144 72     Resp 10/03/17 1144 18     Temp 10/03/17 1144 98 F (36.7 C)     Temp Source 10/03/17 1144 Oral     SpO2 10/03/17 1144 99 %     Weight --      Height --      Head Circumference --      Peak Flow --      Pain Score 10/03/17 1142 5     Pain Loc --      Pain Edu? --      Excl. in GC? --    No data found.  Updated Vital Signs BP (!) 154/91 (BP Location: Left Arm) Comment: large cuff  Pulse 72   Temp 98 F (36.7 C) (Oral)   Resp 18   LMP 11/29/2010   SpO2 99%    Physical Exam  Constitutional: She is oriented to person, place, and time. She appears well-developed and well-nourished. No distress.  HENT:  Head: Normocephalic.  Right Ear: External ear normal.  Left Ear: External ear normal.  Mouth/Throat: Oropharynx is clear and moist.  Eyes: Pupils are equal, round, and reactive to light. EOM are normal.  Cardiovascular: Normal rate, regular rhythm and normal heart sounds.  Pulmonary/Chest: Effort normal and breath sounds normal.  Neurological: She is alert and oriented to person, place, and time. She displays normal reflexes. No cranial nerve deficit. Coordination normal.  Skin: Skin is warm and dry.     UC Treatments / Results  Labs (all labs ordered are listed, but only abnormal results are displayed) Labs Reviewed - No data to display  EKG None  Radiology No results found.  Procedures Procedures (including critical care time)  Medications Ordered in UC Medications  ketorolac (TORADOL) injection 60 mg (has no administration in time range)    Initial Impression / Assessment and Plan / UC Course  I have reviewed the triage vital signs and the nursing notes.  Pertinent labs & imaging results that were available during my care of the patient were reviewed by me and considered in my medical  decision making (see chart for details).     Without redflag findings on exam at this time. Some elevation in BP noted today, which does not appear consistent with baseline for patient per last previous visits. Headache present- from elevated BP or BP elevated due to headache. Opted to treat headache at this time with toradol. Follow up for recheck of BP with PCP. Return precautions provided. Patient verbalized understanding and agreeable to plan.    Final Clinical Impressions(s) / UC Diagnoses   Final diagnoses:  Acute nonintractable headache, unspecified headache type  Elevated blood pressure reading     Discharge Instructions     Continue to decrease to try to quite smoking. Ibuprofen and/or tylenol as needed for headache as this may also contribute to elevated blood pressure. Please follow up with a primary care provider for recheck of your BP as if remains elevated further evaluation and treatment may be indicated. If develop chest pain, palpitations, worsening headache, shortness of breath , leg swelling or otherwise worsening please return sooner.    ED Prescriptions    None     Controlled Substance Prescriptions [place] Controlled Substance Registry consulted? Not Applicable   Georgetta Haber, NP 10/03/17 1211

## 2017-10-03 NOTE — Discharge Instructions (Signed)
Continue to decrease to try to quite smoking. Ibuprofen and/or tylenol as needed for headache as this may also contribute to elevated blood pressure. Please follow up with a primary care provider for recheck of your BP as if remains elevated further evaluation and treatment may be indicated. If develop chest pain, palpitations, worsening headache, shortness of breath , leg swelling or otherwise worsening please return sooner.

## 2017-10-03 NOTE — ED Triage Notes (Signed)
Headache since Friday.  On Friday checked blood pressure 165/100.  Blood pressure has been registering high all weekend.  This morning blood pressure 132/75.  Afterwards started having a nagging headache

## 2018-05-08 ENCOUNTER — Encounter (HOSPITAL_BASED_OUTPATIENT_CLINIC_OR_DEPARTMENT_OTHER): Payer: Self-pay | Admitting: *Deleted

## 2018-05-08 ENCOUNTER — Emergency Department (HOSPITAL_BASED_OUTPATIENT_CLINIC_OR_DEPARTMENT_OTHER)
Admission: EM | Admit: 2018-05-08 | Discharge: 2018-05-08 | Disposition: A | Discharge status: Home / Self Care | Payer: BLUE CROSS/BLUE SHIELD | Attending: Emergency Medicine | Admitting: Emergency Medicine

## 2018-05-08 ENCOUNTER — Other Ambulatory Visit: Payer: Self-pay

## 2018-05-08 ENCOUNTER — Emergency Department (HOSPITAL_BASED_OUTPATIENT_CLINIC_OR_DEPARTMENT_OTHER): Payer: BLUE CROSS/BLUE SHIELD

## 2018-05-08 DIAGNOSIS — F17210 Nicotine dependence, cigarettes, uncomplicated: Secondary | ICD-10-CM | POA: Insufficient documentation

## 2018-05-08 DIAGNOSIS — R05 Cough: Secondary | ICD-10-CM | POA: Diagnosis present

## 2018-05-08 DIAGNOSIS — R69 Illness, unspecified: Secondary | ICD-10-CM

## 2018-05-08 DIAGNOSIS — J111 Influenza due to unidentified influenza virus with other respiratory manifestations: Secondary | ICD-10-CM | POA: Diagnosis not present

## 2018-05-08 DIAGNOSIS — R030 Elevated blood-pressure reading, without diagnosis of hypertension: Secondary | ICD-10-CM

## 2018-05-08 LAB — GROUP A STREP BY PCR: GROUP A STREP BY PCR: NOT DETECTED

## 2018-05-08 MED ORDER — OSELTAMIVIR PHOSPHATE 75 MG PO CAPS: 75.0000 mg | ORAL_CAPSULE | Freq: Two times a day (BID) | ORAL | 0 refills | Status: DC

## 2018-05-08 MED ORDER — ACETAMINOPHEN 325 MG PO TABS
650.0000 mg | ORAL_TABLET | Freq: Once | ORAL | Status: AC
  Administered 2018-05-08: 650 mg via ORAL
  Filled 2018-05-08: qty 2

## 2018-05-08 MED ORDER — BENZONATATE 100 MG PO CAPS: 100.0000 mg | ORAL_CAPSULE | Freq: Three times a day (TID) | ORAL | 0 refills | Status: DC

## 2018-05-08 NOTE — Discharge Instructions (Addendum)
You have been diagnosed today with flulike illness.  At this time there does not appear to be the presence of an emergent medical condition, however there is always the potential for conditions to change. Please read and follow the below instructions.  Please return to the Emergency Department immediately for any new or worsening symptoms or if your symptoms do not improve within 3 days. Please be sure to follow up with your Primary Care Provider this week regarding your visit today; please call their office to schedule an appointment even if you are feeling better for a follow-up visit. You may use the medication Tamiflu as prescribed to help shorten the duration of your symptoms.  This medication does have side effects please read the attached information packet. Use over-the-counter Tylenol and ibuprofen as directed on the packaging to help with your body aches and fever. These drink plenty of water and get plenty of rest to help with your symptoms.  Please call your primary care doctor today to schedule appointment for this week for recheck. You may use the medication Tessalon as prescribed to help with your cough. Additionally your blood pressure was elevated today.  Please follow-up with your primary care provider this week for blood pressure recheck and medication management.  Get help right away if: You develop shortness of breath or difficulty breathing. Your skin or nails turn a bluish color. You have severe pain or stiffness in your neck. You develop a sudden headache or sudden pain in your face or ear. You cannot eat or drink without vomiting. Your cough gets worse. Get help right away if: You feel pain or pressure in your chest. You have shortness of breath. You faint or feel like you will faint. You keep throwing up (vomiting). You feel confused. Get help right away if: You get a very bad headache. You start to feel confused. You feel weak or numb. You feel faint. You get  very bad pain in your: Chest. Belly (abdomen). You throw up (vomit) more than once. You have trouble breathing. You have vision changes  Please read the additional information packets attached to your discharge summary.  Do not take your medicine if  develop an itchy rash, swelling in your mouth or lips, or difficulty breathing.

## 2018-05-08 NOTE — ED Provider Notes (Signed)
[facility] EMERGENCY DEPARTMENT Provider Note   CSN: [id number] Arrival date & time: 05/08/18  0909     History   Chief Complaint Chief Complaint  Patient presents with  . Generalized Body Aches    HPI April [name] is a 48 y.o. female presenting today for cough, fever/chills, generalized body aches that began Saturday evening, less than 48 hours ago.  Patient states that symptoms have gradually progressed since time of onset.  Describes her cough as a moderate in intensity cough with productive yellow sputum, denies hemoptysis.  Patient states that she is felt warm but denies measuring a fever at home.  Patient additionally endorses diffuse generalized body aches, denies focal area of pain.  She denies chest pain or shortness of breath.  Patient states that she has used herbal tea as well as ibuprofen for her symptoms with minimal relief.  Patient states that she is an otherwise healthy 48 year old female without history of chronic medical problems or daily medication use.  Patient denies getting the flu shot this year.  HPI  History reviewed. No pertinent past medical history.  Patient Active Problem List   Diagnosis Date Noted  . Status post total hysterectomy 12/01/2010  . Pelvic peritoneal adhesions, female 12/01/2010  . Hydrosalpinx 12/01/2010    Past Surgical History:  Procedure Laterality Date  . ABDOMINAL HYSTERECTOMY    . NO PAST SURGERIES       OB History   No obstetric history on file.      Home Medications    Prior to Admission medications   Medication Sig Start Date End Date Taking? Authorizing Provider  benzonatate (TESSALON) 100 MG capsule Take 1 capsule (100 mg total) by mouth every 8 (eight) hours. 05/08/18   Harlene Salts A, PA-C  ondansetron (ZOFRAN) 4 MG tablet Take 1 tablet (4 mg total) by mouth every 6 (six) hours. 05/09/17   Moss, Amber, DO  oseltamivir (TAMIFLU) 75 MG capsule Take 1 capsule (75 mg total) by mouth every 12  (twelve) hours. 05/08/18   Bill Salinas, PA-C    Family History Family History  Problem Relation Age of Onset  . Diabetes Mother   . Hypertension Mother   . Hypertension Father   . Thyroid disease Father     Social History Social History   Tobacco Use  . Smoking status: Current Every Day Smoker    Packs/day: 0.50    Types: Cigarettes  . Smokeless tobacco: Never Used  Substance Use Topics  . Alcohol use: Yes  . Drug use: No     Allergies   Patient has no known allergies.   Review of Systems Review of Systems  Constitutional: Positive for chills and fever.  HENT: Positive for congestion, rhinorrhea and sore throat. Negative for drooling, facial swelling, trouble swallowing and voice change.   Eyes: Negative.  Negative for visual disturbance.  Respiratory: Positive for cough. Negative for shortness of breath.   Cardiovascular: Negative.  Negative for chest pain and leg swelling.  Gastrointestinal: Negative.  Negative for abdominal pain, diarrhea, nausea and vomiting.  Musculoskeletal: Positive for arthralgias and myalgias. Negative for neck pain and neck stiffness.  Skin: Negative.  Negative for rash.  Neurological: Negative.  Negative for dizziness, syncope, weakness, light-headedness and numbness.   Physical Exam Updated Vital Signs BP (!) 160/92   Pulse 78   Temp 98.6 F (37 C) (Oral)   Resp 18   Ht 5\' 5"  (1.651 m)   Wt 104.3 kg  LMP 11/29/2010   SpO2 97%   BMI 38.27 kg/m   Physical Exam Constitutional:      General: She is not in acute distress.    Appearance: She is well-developed.  HENT:     Head: Normocephalic and atraumatic.     Right Ear: Hearing, tympanic membrane, ear canal and external ear normal.     Left Ear: Hearing, tympanic membrane, ear canal and external ear normal.     Nose: Congestion and rhinorrhea present. Rhinorrhea is clear.     Right Sinus: No maxillary sinus tenderness or frontal sinus tenderness.     Left Sinus: No  maxillary sinus tenderness or frontal sinus tenderness.     Mouth/Throat:     Lips: Pink.     Mouth: Mucous membranes are moist.     Pharynx: Oropharynx is clear. Uvula midline.     Comments: The patient has normal phonation and is in control of secretions. No stridor.  Midline uvula without edema. Soft palate rises symmetrically. No tonsillar erythema, swelling or exudates. Tongue protrusion is normal, floor of mouth is soft. No trismus. No creptius on neck palpation. No gingival erythema or fluctuance noted. Mucus membranes moist. Eyes:     General: Vision grossly intact. Gaze aligned appropriately.     Extraocular Movements: Extraocular movements intact.     Conjunctiva/sclera: Conjunctivae normal.     Pupils: Pupils are equal, round, and reactive to light.  Neck:     Musculoskeletal: Full passive range of motion without pain, normal range of motion and neck supple.     Trachea: Trachea and phonation normal. No tracheal deviation.  Cardiovascular:     Rate and Rhythm: Normal rate and regular rhythm.     Pulses:          Dorsalis pedis pulses are 2+ on the right side and 2+ on the left side.       Posterior tibial pulses are 2+ on the right side and 2+ on the left side.     Heart sounds: Normal heart sounds.  Pulmonary:     Effort: Pulmonary effort is normal. No respiratory distress.     Breath sounds: Normal breath sounds and air entry. No rhonchi.  Chest:     Chest wall: No tenderness.  Abdominal:     General: Bowel sounds are normal.     Palpations: Abdomen is soft.     Tenderness: There is no abdominal tenderness. There is no guarding or rebound.  Musculoskeletal: Normal range of motion.     Right lower leg: Normal.     Left lower leg: Normal.  Feet:     Right foot:     Protective Sensation: 3 sites tested. 3 sites sensed.     Left foot:     Protective Sensation: 3 sites tested. 3 sites sensed.  Skin:    General: Skin is warm and dry.     Capillary Refill: Capillary  refill takes less than 2 seconds.  Neurological:     Mental Status: She is alert.     GCS: GCS eye subscore is 4. GCS verbal subscore is 5. GCS motor subscore is 6.     Comments: Speech is clear and goal oriented, follows commands Major Cranial nerves without deficit, no facial droop Normal strength in upper and lower extremities bilaterally including dorsiflexion and plantar flexion, strong and equal grip strength Sensation normal to light touch Moves extremities without ataxia, coordination intact Normal gait  Psychiatric:  Mood and Affect: Mood normal.        Behavior: Behavior normal.    ED Treatments / Results  Labs (all labs ordered are listed, but only abnormal results are displayed) Labs Reviewed  GROUP A STREP BY PCR    EKG None  Radiology Dg Chest 2 View  Result Date: 05/08/2018 CLINICAL DATA:  Cough and chills for 2 days EXAM: CHEST - 2 VIEW COMPARISON:  12/25/2013 FINDINGS: The heart size and mediastinal contours are within normal limits. Both lungs are clear. The visualized skeletal structures are unremarkable. IMPRESSION: No active cardiopulmonary disease. Electronically Signed   By: Alcide CleverMark  Lukens M.D.   On: 05/08/2018 11:10    Procedures Procedures (including critical care time)  Medications Ordered in ED Medications  acetaminophen (TYLENOL) tablet 650 mg (650 mg Oral Given 05/08/18 1103)     Initial Impression / Assessment and Plan / ED Course  I have reviewed the triage vital signs and the nursing notes.  Pertinent labs & imaging results that were available during my care of the patient were reviewed by me and considered in my medical decision making (see chart for details).    48 year old otherwise healthy female presenting for 2 days of symptoms consistent with influenza.  Vitals are stable. No signs of dehydration, tolerating PO's.  Lungs are clear to auscultation bilaterally.  Chest x-ray negative Strep test negative  Patient reassessed  proximately 1 hour after Tylenol administration, she states improvement of her symptoms and it is agreeable to discharge.  Discussed the cost versus benefit of Tamiflu treatment with the patient, she is less than 48 hours since symptom onset.  Patient states understanding of risks versus benefits of Tamiflu including neuropsych gastric side effects.  Patient wishes to proceed with Tamiflu for clinical influenza-like illness.  Flu swabs are unavailable at this facility.   Patient instructed to orally hydrate, rest, and use over-the-counter medications such as anti-inflammatories ibuprofen and Aleve for muscle aches and Tylenol for fever.  Patient prescribed cough suppressant tessalon.  At discharge patient is afebrile, not tachycardic, not hypotensive and with SPO2 of 100% on room air.  Resting comfortably and in no acute distress.  Presentation non-concerning for peritonsillar abscess, Ludwig's angina, retropharyngeal abscess or other deep tissue infections of the head or neck. Patient is without trismus or uvula deviation.  Patient is able to drink water in ED without difficulty with intact air way.  The patient was noted to have elevated BP in ED today. I have spoken with the patient regarding elevated blood pressure readings and the need for improved management. I instructed the patient to followup with their PCP within 1 week for BP check. I also counseled the patient regarding the signs and symptoms which would require an emergent visit to an emergency department for hypertensive urgency and/or hypertensive emergency.  At this time there does not appear to be any evidence of an acute emergency medical condition and the patient appears stable for discharge with appropriate outpatient follow up. Diagnosis was discussed with patient who verbalizes understanding of care plan and is agreeable to discharge. I have discussed return precautions with patient and boyfriend who verbalize understanding of return  precautions. Patient strongly encouraged to follow-up with their PCP this week. All questions answered.  Note: Portions of this report may have been transcribed using voice recognition software. Every effort was made to ensure accuracy; however, inadvertent computerized transcription errors may still be present. Final Clinical Impressions(s) / ED Diagnoses   Final diagnoses:  Influenza-like  illness  Elevated blood pressure reading    ED Discharge Orders         Ordered    oseltamivir (TAMIFLU) 75 MG capsule  Every 12 hours     05/08/18 1154    benzonatate (TESSALON) 100 MG capsule  Every 8 hours     05/08/18 1158           Elizabeth Palau 05/08/18 1225    Gwyneth Sprout, MD 05/08/18 2228

## 2018-05-08 NOTE — ED Triage Notes (Signed)
Pt reports cough, body aches, chills x 2 days.

## 2019-02-20 ENCOUNTER — Ambulatory Visit (HOSPITAL_COMMUNITY)
Admission: EM | Admit: 2019-02-20 | Discharge: 2019-02-20 | Disposition: A | Discharge status: Home / Self Care | Payer: BC Managed Care – PPO | Attending: Family Medicine | Admitting: Family Medicine

## 2019-02-20 ENCOUNTER — Other Ambulatory Visit: Payer: Self-pay

## 2019-02-20 ENCOUNTER — Encounter (HOSPITAL_COMMUNITY): Payer: Self-pay

## 2019-02-20 DIAGNOSIS — K047 Periapical abscess without sinus: Secondary | ICD-10-CM

## 2019-02-20 DIAGNOSIS — K0889 Other specified disorders of teeth and supporting structures: Secondary | ICD-10-CM | POA: Diagnosis not present

## 2019-02-20 MED ORDER — AMOXICILLIN-POT CLAVULANATE 875-125 MG PO TABS: 1.0000 | ORAL_TABLET | Freq: Two times a day (BID) | ORAL | 0 refills | Status: DC

## 2019-02-20 MED ORDER — KETOROLAC TROMETHAMINE 60 MG/2ML IM SOLN
INTRAMUSCULAR | Status: AC
  Filled 2019-02-20: qty 2

## 2019-02-20 MED ORDER — IBUPROFEN 800 MG PO TABS: 800.0000 mg | ORAL_TABLET | Freq: Three times a day (TID) | ORAL | 0 refills | Status: DC

## 2019-02-20 MED ORDER — KETOROLAC TROMETHAMINE 60 MG/2ML IM SOLN
60.0000 mg | Freq: Once | INTRAMUSCULAR | Status: AC
  Administered 2019-02-20: 60 mg via INTRAMUSCULAR

## 2019-02-20 NOTE — ED Provider Notes (Signed)
[facility]    CSN: [id number] Arrival date & time: 02/20/19  0900      History   Chief Complaint Chief Complaint  Patient presents with  . Dental Pain    HPI April [name] is a 48 y.o. female.    Dental Pain Location:  Lower (broken tooth with drainage, gum swelling . right side with right facial swelling ) Quality:  Pressure-like, throbbing and sharp Onset quality:  Sudden Duration:  1 day Timing:  Constant Progression:  Unchanged Chronicity:  New Context: abscess, dental caries and dental fracture   Context: normal dentition   Relieved by:  Nothing Worsened by:  Nothing Ineffective treatments:  None tried Associated symptoms: facial pain and facial swelling   Associated symptoms: no congestion, no difficulty swallowing, no drooling, no fever, no gum swelling, no headaches, no neck pain, no neck swelling, no oral bleeding, no oral lesions and no trismus   Risk factors: lack of dental care and smoking     History reviewed. No pertinent past medical history.  Patient Active Problem List   Diagnosis Date Noted  . Status post total hysterectomy 12/01/2010  . Pelvic peritoneal adhesions, female 12/01/2010  . Hydrosalpinx 12/01/2010    Past Surgical History:  Procedure Laterality Date  . ABDOMINAL HYSTERECTOMY    . NO PAST SURGERIES      OB History   No obstetric history on file.      Home Medications    Prior to Admission medications   Medication Sig Start Date End Date Taking? Authorizing Provider  amoxicillin-clavulanate (AUGMENTIN) 875-125 MG tablet Take 1 tablet by mouth every 12 (twelve) hours. 02/20/19   Loura Halt A, NP  ibuprofen (ADVIL) 800 MG tablet Take 1 tablet (800 mg total) by mouth 3 (three) times daily. 02/20/19   Tanvir Hipple, Tressia Miners A, NP  ondansetron (ZOFRAN) 4 MG tablet Take 1 tablet (4 mg total) by mouth every 6 (six) hours. 05/09/17   Dannielle Huh, DO    Family History Family History  Problem Relation Age of Onset   . Diabetes Mother   . Hypertension Mother   . Hypertension Father   . Thyroid disease Father     Social History Social History   Tobacco Use  . Smoking status: Current Every Day Smoker    Packs/day: 0.50    Types: Cigarettes  . Smokeless tobacco: Never Used  Substance Use Topics  . Alcohol use: Yes  . Drug use: No     Allergies   Patient has no known allergies.   Review of Systems Review of Systems  Constitutional: Negative for fever.  HENT: Positive for facial swelling. Negative for congestion, drooling and mouth sores.   Musculoskeletal: Negative for neck pain.  Neurological: Negative for headaches.     Physical Exam Triage Vital Signs ED Triage Vitals  Enc Vitals Group     BP 02/20/19 0918 (!) 169/94     Pulse Rate 02/20/19 0918 66     Resp 02/20/19 0918 18     Temp 02/20/19 0918 (!) 97.3 F (36.3 C)     Temp Source 02/20/19 0918 Tympanic     SpO2 02/20/19 0918 99 %     Weight --      Height --      Head Circumference --      Peak Flow --      Pain Score 02/20/19 0923 10     Pain Loc --      Pain Edu? --  Excl. in GC? --    No data found.  Updated Vital Signs BP (!) 169/94 (BP Location: Right Arm)   Pulse 66   Temp (!) 97.3 F (36.3 C) (Tympanic)   Resp 18   LMP 11/29/2010   SpO2 99%   Visual Acuity Right Eye Distance:   Left Eye Distance:   Bilateral Distance:    Right Eye Near:   Left Eye Near:    Bilateral Near:     Physical Exam Vitals signs and nursing note reviewed.  Constitutional:      General: She is not in acute distress.    Appearance: Normal appearance. She is not ill-appearing, toxic-appearing or diaphoretic.     Comments: Appears in pain   HENT:     Head: Normocephalic and atraumatic.     Nose: Nose normal.     Mouth/Throat:     Dentition: Dental tenderness, gingival swelling, dental caries and dental abscesses present.     Pharynx: Oropharynx is clear.  Eyes:     Conjunctiva/sclera: Conjunctivae normal.   Neck:     Musculoskeletal: Normal range of motion.  Pulmonary:     Effort: Pulmonary effort is normal.  Musculoskeletal: Normal range of motion.  Skin:    General: Skin is warm and dry.     Findings: No rash.  Neurological:     Mental Status: She is alert.  Psychiatric:        Mood and Affect: Mood normal.      UC Treatments / Results  Labs (all labs ordered are listed, but only abnormal results are displayed) Labs Reviewed - No data to display  EKG   Radiology No results found.  Procedures Procedures (including critical care time)  Medications Ordered in UC Medications  ketorolac (TORADOL) injection 60 mg (60 mg Intramuscular Given 02/20/19 0952)  ketorolac (TORADOL) 60 MG/2ML injection (has no administration in time range)    Initial Impression / Assessment and Plan / UC Course  I have reviewed the triage vital signs and the nursing notes.  Pertinent labs & imaging results that were available during my care of the patient were reviewed by me and considered in my medical decision making (see chart for details).     Dental pain and infection- given Toradol here for acute pain Ibuprofen sent to the pharmacy for pain and Augmentin to treat the dental infection Contact given for dentist to follow up.  Return precautions given  Final Clinical Impressions(s) / UC Diagnoses   Final diagnoses:  None     Discharge Instructions     Treating you for dental infection Take the antibiotics as prescribed  Toradol given here for pain You can take 800 mg ibuprofen for pain as needed.  Follow up with the dentist.  Call Dr. Ian Malkin office   [phone]    ED Prescriptions    Medication Sig Dispense Auth. Provider   ibuprofen (ADVIL) 800 MG tablet Take 1 tablet (800 mg total) by mouth 3 (three) times daily. 21 tablet Michaeljames Milnes A, NP   amoxicillin-clavulanate (AUGMENTIN) 875-125 MG tablet Take 1 tablet by mouth every 12 (twelve) hours. 14 tablet Marquise Wicke A, NP      PDMP not reviewed this encounter.   Dahlia Byes A, NP 02/20/19 1002

## 2019-02-20 NOTE — Discharge Instructions (Addendum)
Treating you for dental infection Take the antibiotics as prescribed  Toradol given here for pain You can take 800 mg ibuprofen for pain as needed.  Follow up with the dentist.  Call Dr. Marisa Sprinkles office   [phone]

## 2019-02-20 NOTE — ED Triage Notes (Signed)
Pt presents with right side dental pain since yesterday. 

## 2019-08-16 ENCOUNTER — Ambulatory Visit (HOSPITAL_COMMUNITY)
Admission: EM | Admit: 2019-08-16 | Discharge: 2019-08-16 | Disposition: A | Discharge status: Home / Self Care | Payer: BC Managed Care – PPO | Attending: Urgent Care | Admitting: Urgent Care

## 2019-08-16 ENCOUNTER — Other Ambulatory Visit: Payer: Self-pay

## 2019-08-16 ENCOUNTER — Encounter (HOSPITAL_COMMUNITY): Payer: Self-pay

## 2019-08-16 DIAGNOSIS — K047 Periapical abscess without sinus: Secondary | ICD-10-CM

## 2019-08-16 DIAGNOSIS — K0889 Other specified disorders of teeth and supporting structures: Secondary | ICD-10-CM | POA: Diagnosis not present

## 2019-08-16 MED ORDER — AMOXICILLIN-POT CLAVULANATE 875-125 MG PO TABS: 1.0000 | ORAL_TABLET | Freq: Two times a day (BID) | ORAL | 0 refills | Status: DC

## 2019-08-16 MED ORDER — IBUPROFEN 800 MG PO TABS
ORAL_TABLET | ORAL | Status: AC
  Filled 2019-08-16: qty 1

## 2019-08-16 MED ORDER — NAPROXEN 500 MG PO TABS: 500.0000 mg | ORAL_TABLET | Freq: Two times a day (BID) | ORAL | 0 refills | Status: DC

## 2019-08-16 MED ORDER — IBUPROFEN 800 MG PO TABS
800.0000 mg | ORAL_TABLET | Freq: Once | ORAL | Status: AC
  Administered 2019-08-16: 13:00:00 800 mg via ORAL

## 2019-08-16 NOTE — ED Triage Notes (Signed)
Patient reports her tooth broke off two days ago. Reports pain started today. States she believes there is an abscess.

## 2019-08-16 NOTE — Discharge Instructions (Addendum)
[facility] [phone] extension [phone]  Dr. Civils [phone] [address]  [facility] [phone] [address]  [facility] [phone] extension [phone] [place], [place], [address] First come first serve for the first 10 clients.  May do simple extractions only, no wisdom teeth or surgery.  You may try the second for Thursday of the month starting at 6:30 AM.  [employer] You may call the school to see if they are still helping to provide dental care for emergent cases.  

## 2019-08-16 NOTE — ED Provider Notes (Signed)
[facility]   MRN: [id number] DOB: [birth year]  Subjective:   April [name] is a 49 y.o. female presenting for 2-day history of cute onset worsening severe right lower sided pain, facial swelling and difficulty chewing/swallowing.  Patient thinks that she injured her tooth while eating, states that it is cracked open.  She has a dentist that is done work with her previously but was not able to get in with them soon.  Has a history of dental infections.  No current facility-administered medications for this encounter.  Current Outpatient Medications:  .  amoxicillin-clavulanate (AUGMENTIN) 875-125 MG tablet, Take 1 tablet by mouth every 12 (twelve) hours., Disp: 14 tablet, Rfl: 0 .  ibuprofen (ADVIL) 800 MG tablet, Take 1 tablet (800 mg total) by mouth 3 (three) times daily., Disp: 21 tablet, Rfl: 0 .  ondansetron (ZOFRAN) 4 MG tablet, Take 1 tablet (4 mg total) by mouth every 6 (six) hours., Disp: 12 tablet, Rfl: 0   No Known Allergies  History reviewed. No pertinent past medical history.   Past Surgical History:  Procedure Laterality Date  . ABDOMINAL HYSTERECTOMY    . NO PAST SURGERIES      Family History  Problem Relation Age of Onset  . Diabetes Mother   . Hypertension Mother   . Hypertension Father   . Thyroid disease Father     Social History   Tobacco Use  . Smoking status: Current Every Day Smoker    Packs/day: 0.50    Types: Cigarettes  . Smokeless tobacco: Never Used  Substance Use Topics  . Alcohol use: Yes  . Drug use: No    ROS   Objective:   Vitals: BP (!) 148/65 (BP Location: Right Arm)   Pulse 65   Temp 98.1 F (36.7 C) (Oral)   Resp 14   LMP 11/29/2010   SpO2 96%   Physical Exam Constitutional:      General: She is not in acute distress.    Appearance: Normal appearance. She is well-developed. She is not ill-appearing, toxic-appearing or diaphoretic.  HENT:     Head: Normocephalic and atraumatic.      Nose: Nose  normal.     Mouth/Throat:     Mouth: Mucous membranes are moist.     Dentition: Dental tenderness, gingival swelling and dental caries present.     Pharynx: Oropharynx is clear.   Eyes:     General: No scleral icterus.    Extraocular Movements: Extraocular movements intact.     Pupils: Pupils are equal, round, and reactive to light.  Cardiovascular:     Rate and Rhythm: Normal rate.  Pulmonary:     Effort: Pulmonary effort is normal.  Skin:    General: Skin is warm and dry.  Neurological:     General: No focal deficit present.     Mental Status: She is alert and oriented to person, place, and time.  Psychiatric:        Mood and Affect: Mood normal.        Behavior: Behavior normal.       Assessment and Plan :   PDMP not reviewed this encounter.  1. Pain, dental   2. Dental infection     Start Augmentin for dental infection/abscess, use naproxen for pain and inflammation. Emphasized need for dental surgeon consult. Counseled patient on potential for adverse effects with medications prescribed/recommended today, strict ER and return-to-clinic precautions discussed, patient verbalized understanding.    Wallis Bamberg, PA-C 08/16/19 1432

## 2019-11-06 ENCOUNTER — Ambulatory Visit (HOSPITAL_COMMUNITY)
Admission: EM | Admit: 2019-11-06 | Discharge: 2019-11-06 | Disposition: A | Discharge status: Home / Self Care | Payer: BC Managed Care – PPO | Attending: Family Medicine | Admitting: Family Medicine

## 2019-11-06 ENCOUNTER — Other Ambulatory Visit: Payer: Self-pay

## 2019-11-06 ENCOUNTER — Encounter (HOSPITAL_COMMUNITY): Payer: Self-pay | Admitting: Emergency Medicine

## 2019-11-06 DIAGNOSIS — R600 Localized edema: Secondary | ICD-10-CM | POA: Diagnosis not present

## 2019-11-06 MED ORDER — CEPHALEXIN 500 MG PO CAPS: 500.0000 mg | ORAL_CAPSULE | Freq: Two times a day (BID) | ORAL | 0 refills | Status: DC

## 2019-11-06 NOTE — Discharge Instructions (Signed)
Warm compresses Ibuprofen for pain Take antibiotic as directed

## 2019-11-06 NOTE — ED Triage Notes (Signed)
Took a nap on Sunday and woke up to left side facial swelling, fullness sensation in left ear. She had one leftover antibiotic from a previous dental infection so she took that and has been applying ice to area. Tenderness and pain over left lower jaw.

## 2019-11-06 NOTE — ED Provider Notes (Signed)
[facility]    CSN: [id number] Arrival date & time: 11/06/19  1343      History   Chief Complaint Chief Complaint  Patient presents with  . Facial Swelling    HPI April [name] is a 49 y.o. female.   HPI  Patient states that she has known dental problems.  She states that she had amoxicillin from her prior dental infection.  She states that she woke up from a nap yesterday with the left side of her face swollen.  It is not painful.  She points to her left cheek in front of her ear.  She had some ear pressure but no pain.  No cough cold runny nose or sore throat.  She presumed this was a dental infection.  Today the swelling is gone down but her cheek is quite tender.  No pain with chewing   History reviewed. No pertinent past medical history.  Patient Active Problem List   Diagnosis Date Noted  . Status post total hysterectomy 12/01/2010  . Pelvic peritoneal adhesions, female 12/01/2010  . Hydrosalpinx 12/01/2010    Past Surgical History:  Procedure Laterality Date  . ABDOMINAL HYSTERECTOMY    . NO PAST SURGERIES      OB History   No obstetric history on file.      Home Medications    Prior to Admission medications   Medication Sig Start Date End Date Taking? Authorizing Provider  cephALEXin (KEFLEX) 500 MG capsule Take 1 capsule (500 mg total) by mouth 2 (two) times daily. 11/06/19   Eustace Moore, MD  ibuprofen (ADVIL) 800 MG tablet Take 1 tablet (800 mg total) by mouth 3 (three) times daily. 02/20/19   Dahlia Byes A, NP  naproxen (NAPROSYN) 500 MG tablet Take 1 tablet (500 mg total) by mouth 2 (two) times daily. 08/16/19   Wallis Bamberg, PA-C  ondansetron (ZOFRAN) 4 MG tablet Take 1 tablet (4 mg total) by mouth every 6 (six) hours. 05/09/17   Rolm Bookbinder, DO    Family History Family History  Problem Relation Age of Onset  . Diabetes Mother   . Hypertension Mother   . Hypertension Father   . Thyroid disease Father     Social  History Social History   Tobacco Use  . Smoking status: Current Every Day Smoker    Packs/day: 0.50    Types: Cigarettes  . Smokeless tobacco: Never Used  Substance Use Topics  . Alcohol use: Yes  . Drug use: No     Allergies   Patient has no known allergies.   Review of Systems Review of Systems See HPI Physical Exam Triage Vital Signs ED Triage Vitals  Enc Vitals Group     BP 11/06/19 1500 (!) 152/94     Pulse Rate 11/06/19 1500 76     Resp 11/06/19 1500 18     Temp 11/06/19 1500 98.2 F (36.8 C)     Temp Source 11/06/19 1500 Oral     SpO2 11/06/19 1500 97 %     Weight --      Height --      Head Circumference --      Peak Flow --      Pain Score 11/06/19 1458 5     Pain Loc --      Pain Edu? --      Excl. in GC? --    No data found.  Updated Vital Signs BP (!) 152/94   Pulse 76  Temp 98.2 F (36.8 C) (Oral)   Resp 18   LMP 11/29/2010   SpO2 97%   Visual Acuity Right Eye Distance:   Left Eye Distance:   Bilateral Distance:    Right Eye Near:   Left Eye Near:    Bilateral Near:     Physical Exam Constitutional:      General: She is not in acute distress.    Appearance: She is well-developed.  HENT:     Head: Normocephalic and atraumatic.     Nose: Nose normal.     Mouth/Throat:     Mouth: Mucous membranes are moist.     Pharynx: Posterior oropharyngeal erythema present.     Comments: Tender over left cheek, parotid region.  Inside the mouth adjacent to the upper left molars there is erythema of the buccal mucosa Eyes:     Conjunctiva/sclera: Conjunctivae normal.     Pupils: Pupils are equal, round, and reactive to light.  Cardiovascular:     Rate and Rhythm: Normal rate.  Pulmonary:     Effort: Pulmonary effort is normal. No respiratory distress.  Abdominal:     General: There is no distension.     Palpations: Abdomen is soft.  Musculoskeletal:        General: Normal range of motion.     Cervical back: Normal range of motion.   Skin:    General: Skin is warm and dry.  Neurological:     Mental Status: She is alert.  Psychiatric:        Mood and Affect: Mood normal.        Behavior: Behavior normal.      UC Treatments / Results  Labs (all labs ordered are listed, but only abnormal results are displayed) Labs Reviewed - No data to display  EKG   Radiology No results found.  Procedures Procedures (including critical care time)  Medications Ordered in UC Medications - No data to display  Initial Impression / Assessment and Plan / UC Course  I have reviewed the triage vital signs and the nursing notes.  Pertinent labs & imaging results that were available during my care of the patient were reviewed by me and considered in my medical decision making (see chart for details).     Likely parotid swelling given the painless nature concerning onset.  We will treat her with antibiotics and she does have erythema over the duct.  Mild tenderness.  Adenopathy.  Poor dentition Final Clinical Impressions(s) / UC Diagnoses   Final diagnoses:  Swelling of left parotid gland     Discharge Instructions     Warm compresses Ibuprofen for pain Take antibiotic as directed   ED Prescriptions    Medication Sig Dispense Auth. Provider   cephALEXin (KEFLEX) 500 MG capsule Take 1 capsule (500 mg total) by mouth 2 (two) times daily. 10 capsule Eustace Moore, MD     PDMP not reviewed this encounter.   Eustace Moore, MD 11/06/19 [phone]

## 2019-11-21 ENCOUNTER — Encounter (HOSPITAL_COMMUNITY): Payer: Self-pay

## 2019-11-21 ENCOUNTER — Other Ambulatory Visit: Payer: Self-pay

## 2019-11-21 ENCOUNTER — Ambulatory Visit (HOSPITAL_COMMUNITY)
Admission: EM | Admit: 2019-11-21 | Discharge: 2019-11-21 | Disposition: A | Discharge status: Home / Self Care | Payer: BC Managed Care – PPO | Attending: Family Medicine | Admitting: Family Medicine

## 2019-11-21 DIAGNOSIS — K047 Periapical abscess without sinus: Secondary | ICD-10-CM | POA: Diagnosis not present

## 2019-11-21 MED ORDER — AMOXICILLIN 500 MG PO CAPS: 500.0000 mg | ORAL_CAPSULE | Freq: Three times a day (TID) | ORAL | 0 refills | Status: DC

## 2019-11-21 NOTE — Discharge Instructions (Addendum)
Follow-up with

## 2019-11-21 NOTE — ED Triage Notes (Signed)
Pt presents to UC for left sided facial swelling. Pt seen on 7/6 for same given abx finished course but symptoms still present. Upon assessment pt noted to have  Swelling on left jaw to left ear. Pt c/o pain, difficulty hearing out of left ear, and hoarse voice. Pt has been treating with ice and ibuprofen with out relief.

## 2019-11-21 NOTE — ED Provider Notes (Signed)
[facility]    CSN: [id number] Arrival date & time: 11/21/19  1532      History   Chief Complaint Chief Complaint  Patient presents with  . Facial Swelling    HPI April [name] is a 49 y.o. female.   Patient presents with left-sided facial swelling.  Was seen on 7 6 for similar symptoms and given antibiotic for what was thought then to be a parotid gland infection.  She was also seen in April for similar symptoms and treated for dental abscess.  She endorses a broken tooth on the affected side but denies tooth ache.  HPI  History reviewed. No pertinent past medical history.  Patient Active Problem List   Diagnosis Date Noted  . Status post total hysterectomy 12/01/2010  . Pelvic peritoneal adhesions, female 12/01/2010  . Hydrosalpinx 12/01/2010    Past Surgical History:  Procedure Laterality Date  . ABDOMINAL HYSTERECTOMY    . NO PAST SURGERIES      OB History   No obstetric history on file.      Home Medications    Prior to Admission medications   Medication Sig Start Date End Date Taking? Authorizing Provider  cephALEXin (KEFLEX) 500 MG capsule Take 1 capsule (500 mg total) by mouth 2 (two) times daily. 11/06/19   Eustace Moore, MD  ibuprofen (ADVIL) 800 MG tablet Take 1 tablet (800 mg total) by mouth 3 (three) times daily. 02/20/19   Dahlia Byes A, NP  naproxen (NAPROSYN) 500 MG tablet Take 1 tablet (500 mg total) by mouth 2 (two) times daily. 08/16/19   Wallis Bamberg, PA-C  ondansetron (ZOFRAN) 4 MG tablet Take 1 tablet (4 mg total) by mouth every 6 (six) hours. 05/09/17   Rolm Bookbinder, DO    Family History Family History  Problem Relation Age of Onset  . Diabetes Mother   . Hypertension Mother   . Hypertension Father   . Thyroid disease Father     Social History Social History   Tobacco Use  . Smoking status: Current Every Day Smoker    Packs/day: 0.50    Types: Cigarettes  . Smokeless tobacco: Never Used  Substance Use  Topics  . Alcohol use: Yes  . Drug use: No     Allergies   Patient has no known allergies.   Review of Systems Review of Systems  HENT: Positive for ear pain and facial swelling.   All other systems reviewed and are negative.    Physical Exam Triage Vital Signs ED Triage Vitals  Enc Vitals Group     BP 11/21/19 1639 (!) 151/92     Pulse Rate 11/21/19 1639 75     Resp 11/21/19 1639 16     Temp 11/21/19 1639 98.2 F (36.8 C)     Temp src --      SpO2 11/21/19 1639 99 %     Weight --      Height --      Head Circumference --      Peak Flow --      Pain Score 11/21/19 1641 6     Pain Loc --      Pain Edu? --      Excl. in GC? --    No data found.  Updated Vital Signs BP (!) 151/92 (BP Location: Right Arm)   Pulse 75   Temp 98.2 F (36.8 C)   Resp 16   LMP 11/29/2010   SpO2 99%   Visual  Acuity Right Eye Distance:   Left Eye Distance:   Bilateral Distance:    Right Eye Near:   Left Eye Near:    Bilateral Near:     Physical Exam Vitals and nursing note reviewed.  Constitutional:      Appearance: Normal appearance.  HENT:     Head: Normocephalic.     Right Ear: Tympanic membrane normal.     Left Ear: Tympanic membrane normal.     Nose: Nose normal.     Mouth/Throat:     Comments: Throat is clear there are some tender lymph nodes on the left, submandibular.  The second and third molars on the left lower gumline are tender to percussion with tongue blade. Neurological:     Mental Status: She is alert.      UC Treatments / Results  Labs (all labs ordered are listed, but only abnormal results are displayed) Labs Reviewed - No data to display  EKG   Radiology No results found.  Procedures Procedures (including critical care time)  Medications Ordered in UC Medications - No data to display  Initial Impression / Assessment and Plan / UC Course  I have reviewed the triage vital signs and the nursing notes.  Pertinent labs & imaging results  that were available during my care of the patient were reviewed by me and considered in my medical decision making (see chart for details).     I believe her facial swelling is related to a tooth infection that was treated with antibiotic but not removed and has now recurred.  Once again will provide antibiotic but encourage dental visit Final Clinical Impressions(s) / UC Diagnoses   Final diagnoses:  None   Discharge Instructions   None    ED Prescriptions    None     PDMP not reviewed this encounter.   Frederica Kuster, MD 11/21/19 [phone]

## 2019-12-04 ENCOUNTER — Encounter: Payer: Self-pay | Admitting: Nurse Practitioner

## 2019-12-04 ENCOUNTER — Ambulatory Visit: Payer: BC Managed Care – PPO | Admitting: Nurse Practitioner

## 2019-12-04 ENCOUNTER — Other Ambulatory Visit: Payer: Self-pay

## 2019-12-04 VITALS — BP 118/88 | HR 77 | Temp 97.9°F | Ht 64.0 in | Wt 224.8 lb

## 2019-12-04 DIAGNOSIS — Z1231 Encounter for screening mammogram for malignant neoplasm of breast: Secondary | ICD-10-CM | POA: Diagnosis not present

## 2019-12-04 DIAGNOSIS — R7309 Other abnormal glucose: Secondary | ICD-10-CM

## 2019-12-04 DIAGNOSIS — Z1159 Encounter for screening for other viral diseases: Secondary | ICD-10-CM | POA: Diagnosis not present

## 2019-12-04 DIAGNOSIS — R6884 Jaw pain: Secondary | ICD-10-CM

## 2019-12-04 DIAGNOSIS — Z13228 Encounter for screening for other metabolic disorders: Secondary | ICD-10-CM

## 2019-12-04 DIAGNOSIS — K029 Dental caries, unspecified: Secondary | ICD-10-CM

## 2019-12-04 MED ORDER — TRAMADOL HCL 50 MG PO TABS
50.0000 mg | ORAL_TABLET | Freq: Four times a day (QID) | ORAL | 0 refills | Status: DC | PRN
Start: 2019-12-04 — End: 2020-04-03

## 2019-12-04 NOTE — Progress Notes (Signed)
This visit occurred during the SARS-CoV-2 public health emergency.  Safety protocols were in place, including screening questions prior to the visit, additional usage of staff PPE, and extensive cleaning of exam room while observing appropriate contact time as indicated for disinfecting solutions.  Subjective:     Patient ID: April [name] , female    DOB: 1970/09/22 , 49 y.o.   MRN: [id number]   Chief Complaint  Patient presents with  . Hypertension    HPI  She is here to reestablish care, she had been seeing Malachy Chamber NP, last time seen was 2-3 years. Had not been sick. She is working as a Occupational psychologist.  She is working from home now and they are planning to go back to the office. She had a brother who has passed away unknown cause. She is currently separated. She has 2 children 6 y/o and 69 y/o. She had been seeing Dr. Cherly Hensen (has not had a mammogram)  PMH partial hysterectomy in 2012, previous history of prediabetes had taken victoza and phentermine - she did well with the medications.    [facility] - mother - diabetes, HTN, feet swelling, seasonal allergies. Father - HTN. Thyroid problems.    She went to urgent care on 7/5 with a swollen face had glandular swelling, treated with antibiotic.  She had to go back for it to come back quickly and was recommended to go to the Dentist - she has an appt with him next week.  When she went her blood pressure was elevated. She had been having tooth pain.   She did one round of phentermine     History reviewed. No pertinent past medical history.   Family History  Problem Relation Age of Onset  . Diabetes Mother   . Hypertension Mother   . Hypertension Father   . Thyroid disease Father      Current Outpatient Medications:  .  amoxicillin (AMOXIL) 500 MG capsule, Take 1 capsule (500 mg total) by mouth 3 (three) times daily., Disp: 21 capsule, Rfl: 0 .  traMADol (ULTRAM) 50 MG tablet, Take 1 tablet (50 mg  total) by mouth every 6 (six) hours as needed., Disp: 20 tablet, Rfl: 0   No Known Allergies   Review of Systems  Constitutional: Negative.  Negative for fatigue.  Eyes: Negative for visual disturbance.  Respiratory: Negative.  Negative for shortness of breath.   Cardiovascular: Negative.  Negative for chest pain, palpitations and leg swelling.  Gastrointestinal: Negative.   Endocrine: Negative.   Musculoskeletal: Negative.   Skin: Negative.   Neurological: Negative for dizziness, weakness and headaches.  Psychiatric/Behavioral: Negative for confusion. The patient is not nervous/anxious.      Today's Vitals   12/04/19 1542  BP: 118/88  Pulse: 77  Temp: 97.9 F (36.6 C)  TempSrc: Oral  Weight: 224 lb 12.8 oz (102 kg)  Height: 5\' 4"  (1.626 m)  PainSc: 0-No pain   Body mass index is 38.59 kg/m.   Objective:  Physical Exam Constitutional:      General: She is not in acute distress.    Appearance: Normal appearance.  Cardiovascular:     Rate and Rhythm: Normal rate and regular rhythm.     Pulses: Normal pulses.     Heart sounds: Normal heart sounds. No murmur heard.   Pulmonary:     Effort: Pulmonary effort is normal. No respiratory distress.     Breath sounds: Normal breath sounds.  Skin:    Capillary Refill: Capillary  refill takes less than 2 seconds.  Neurological:     General: No focal deficit present.     Mental Status: She is alert and oriented to person, place, and time.     Cranial Nerves: No cranial nerve deficit.  Psychiatric:        Mood and Affect: Mood normal.        Behavior: Behavior normal.        Thought Content: Thought content normal.        Judgment: Judgment normal.         Assessment And Plan:     1. Abnormal glucose Comments: will check HgbA1c since has not seen provider in quite some time - CBC - Hemoglobin A1c  2. Screening mammogram, encounter for  Pt instructed on Self Breast Exam.According to ACOG guidelines Women aged 71 and  older are recommended to get an annual mammogram. Form completed and given to patient contact the [facility] for appointment scheduing.   Pt encouraged to get annual mammogram - MM DIGITAL SCREENING BILATERAL; Future  3. Encounter for screening for metabolic disorder - Lipid panel  4. Encounter for hepatitis C screening test for low risk patient  Will check Hepatitis C screening due to recent recommendations to screen all adults 18 years and older - Hepatitis C antibody  5. Jaw pain  She is scheduled to see a dentist in the next few weeks and is having persistent jaw pain  Will provide a limited amount of tramadol - traMADol (ULTRAM) 50 MG tablet; Take 1 tablet (50 mg total) by mouth every 6 (six) hours as needed.  Dispense: 20 tablet; Refill: 0  6. Dental caries  Poor dentation, she is to see a dentist in the next few weeks     Patient was given opportunity to ask questions. Patient verbalized understanding of the plan and was able to repeat key elements of the plan. All questions were answered to their satisfaction.  Arnette Felts, FNP   I, Arnette Felts, FNP, have reviewed all documentation for this visit. The documentation on 01/02/20 for the exam, diagnosis, procedures, and orders are all accurate and complete.  THE PATIENT IS ENCOURAGED TO PRACTICE SOCIAL DISTANCING DUE TO THE COVID-19 PANDEMIC.

## 2019-12-04 NOTE — Patient Instructions (Signed)

## 2019-12-05 LAB — CBC
Hematocrit: 47.5 % — ABNORMAL HIGH (ref 34.0–46.6)
Hemoglobin: 15.3 g/dL (ref 11.1–15.9)
MCH: 27.6 pg (ref 26.6–33.0)
MCHC: 32.2 g/dL (ref 31.5–35.7)
MCV: 86 fL (ref 79–97)
Platelets: 210 10*3/uL (ref 150–450)
RBC: 5.55 x10E6/uL — ABNORMAL HIGH (ref 3.77–5.28)
RDW: 14 % (ref 11.7–15.4)
WBC: 7.5 10*3/uL (ref 3.4–10.8)

## 2019-12-05 LAB — LIPID PANEL
Chol/HDL Ratio: 3.3 ratio (ref 0.0–4.4)
Cholesterol, Total: 223 mg/dL — ABNORMAL HIGH (ref 100–199)
HDL: 67 mg/dL (ref >39)
LDL Chol Calc (NIH): 141 mg/dL — ABNORMAL HIGH (ref 0–99)
Triglycerides: 85 mg/dL (ref 0–149)
VLDL Cholesterol Cal: 15 mg/dL (ref 5–40)

## 2019-12-05 LAB — HEMOGLOBIN A1C
Est. average glucose Bld gHb Est-mCnc: 117 mg/dL
Hgb A1c MFr Bld: 5.7 % — ABNORMAL HIGH (ref 4.8–5.6)

## 2019-12-05 LAB — HEPATITIS C ANTIBODY: Hep C Virus Ab: <0.1 s/co ratio (ref 0.0–0.9)

## 2019-12-24 ENCOUNTER — Ambulatory Visit: Payer: BC Managed Care – PPO

## 2020-01-15 ENCOUNTER — Ambulatory Visit: Payer: BC Managed Care – PPO | Admitting: Nurse Practitioner

## 2020-01-15 NOTE — Progress Notes (Signed)
We can try diet and exercise this time but if elevated after 3 months will start medication. Encourage her to try a plant based diet

## 2020-02-04 ENCOUNTER — Ambulatory Visit: Payer: BC Managed Care – PPO | Admitting: Nurse Practitioner

## 2020-04-03 ENCOUNTER — Other Ambulatory Visit: Payer: Self-pay

## 2020-04-03 ENCOUNTER — Ambulatory Visit (HOSPITAL_COMMUNITY)
Admission: EM | Admit: 2020-04-03 | Discharge: 2020-04-03 | Disposition: A | Discharge status: Home / Self Care | Payer: 59 | Attending: Family | Admitting: Family

## 2020-04-03 ENCOUNTER — Encounter (HOSPITAL_COMMUNITY): Payer: Self-pay | Admitting: Emergency Medicine

## 2020-04-03 DIAGNOSIS — R509 Fever, unspecified: Secondary | ICD-10-CM | POA: Diagnosis present

## 2020-04-03 DIAGNOSIS — R059 Cough, unspecified: Secondary | ICD-10-CM | POA: Diagnosis not present

## 2020-04-03 DIAGNOSIS — R6883 Chills (without fever): Secondary | ICD-10-CM | POA: Diagnosis not present

## 2020-04-03 DIAGNOSIS — R110 Nausea: Secondary | ICD-10-CM | POA: Insufficient documentation

## 2020-04-03 DIAGNOSIS — F17210 Nicotine dependence, cigarettes, uncomplicated: Secondary | ICD-10-CM | POA: Diagnosis not present

## 2020-04-03 DIAGNOSIS — R6889 Other general symptoms and signs: Secondary | ICD-10-CM

## 2020-04-03 DIAGNOSIS — Z20822 Contact with and (suspected) exposure to covid-19: Secondary | ICD-10-CM | POA: Diagnosis not present

## 2020-04-03 LAB — RESP PANEL BY RT-PCR (FLU A&B, COVID) ARPGX2
Influenza A by PCR: NEGATIVE
Influenza B by PCR: NEGATIVE
SARS Coronavirus 2 by RT PCR: NEGATIVE

## 2020-04-03 MED ORDER — BENZONATATE 100 MG PO CAPS: 100.0000 mg | ORAL_CAPSULE | Freq: Three times a day (TID) | ORAL | 0 refills | Status: DC | PRN

## 2020-04-03 MED ORDER — OSELTAMIVIR PHOSPHATE 75 MG PO CAPS: 75.0000 mg | ORAL_CAPSULE | Freq: Two times a day (BID) | ORAL | 0 refills | Status: AC

## 2020-04-03 NOTE — ED Provider Notes (Signed)
[facility]    CSN: [id number] Arrival date & time: 04/03/20  1749      History   Chief Complaint Chief Complaint  Patient presents with  . Chills    HPI April [name] is a 49 y.o. female.   49 year old female presents with sudden onset of chills, body aches and low grade fever 2 days ago that is getting worse. Also having a productive cough and irritated throat. Denies any nasal congestion, vomiting or diarrhea. Does have some nausea and decreased appetite. Has been taking Alka Seltzer cold medication, drinking Gingerale and taking Ibuprofen with minimal relief. Has not been vaccinated against COVID 19 or influenza this year. No known exposure to COVID 19. No other chronic health issues. Takes no daily medication.   The history is provided by the patient.    History reviewed. No pertinent past medical history.  Patient Active Problem List   Diagnosis Date Noted  . Status post total hysterectomy 12/01/2010  . Pelvic peritoneal adhesions, female 12/01/2010  . Hydrosalpinx 12/01/2010    Past Surgical History:  Procedure Laterality Date  . ABDOMINAL HYSTERECTOMY    . NO PAST SURGERIES      OB History   No obstetric history on file.      Home Medications    Prior to Admission medications   Medication Sig Start Date End Date Taking? Authorizing Provider  ibuprofen (ADVIL) 200 MG tablet Take 200 mg by mouth every 6 (six) hours as needed.   Yes [provider]  Phenyleph-Doxylamine-DM-APAP (ALKA SELTZER PLUS PO) Take by mouth.   Yes [provider]  benzonatate (TESSALON) 100 MG capsule Take 1 capsule (100 mg total) by mouth 3 (three) times daily as needed for cough. 04/03/20   Sudie Grumbling, NP  oseltamivir (TAMIFLU) 75 MG capsule Take 1 capsule (75 mg total) by mouth every 12 (twelve) hours for 5 days. 04/03/20 04/08/20  Sudie Grumbling, NP    Family History Family History  Problem Relation Age of Onset  . Diabetes Mother     . Hypertension Mother   . Hypertension Father   . Thyroid disease Father     Social History Social History   Tobacco Use  . Smoking status: Current Every Day Smoker    Packs/day: 0.50    Types: Cigarettes  . Smokeless tobacco: Never Used  . Tobacco comment: every now and then  Substance Use Topics  . Alcohol use: Yes  . Drug use: Yes    Types: Marijuana     Allergies   Patient has no known allergies.   Review of Systems Review of Systems  Constitutional: Positive for activity change, appetite change, chills, fatigue and fever.  HENT: Positive for congestion (slight chest) and sore throat (irritated). Negative for ear discharge, ear pain, mouth sores, nosebleeds, postnasal drip, rhinorrhea, sinus pressure, sinus pain and trouble swallowing.   Eyes: Negative for pain, discharge, redness and itching.  Respiratory: Positive for cough. Negative for chest tightness, shortness of breath and wheezing.   Gastrointestinal: Positive for nausea. Negative for diarrhea and vomiting.  Musculoskeletal: Positive for arthralgias and myalgias. Negative for back pain, neck pain and neck stiffness.  Skin: Negative for color change and rash.  Allergic/Immunologic: Negative for environmental allergies, food allergies and immunocompromised state.  Neurological: Positive for headaches. Negative for dizziness, seizures, syncope, weakness and numbness.  Hematological: Negative for adenopathy. Does not bruise/bleed easily.     Physical Exam Triage Vital Signs ED Triage Vitals  Enc Vitals Group     BP 04/03/20 0933 (!) 157/81     Pulse Rate 04/03/20 0933 74     Resp 04/03/20 0933 20     Temp 04/03/20 0933 98 F (36.7 C)     Temp Source 04/03/20 0933 Oral     SpO2 04/03/20 0933 97 %     Weight --      Height --      Head Circumference --      Peak Flow --      Pain Score 04/03/20 0929 6     Pain Loc --      Pain Edu? --      Excl. in GC? --    No data found.  Updated Vital Signs BP  (!) 157/81 (BP Location: Left Arm)   Pulse 74   Temp 98 F (36.7 C) (Oral)   Resp 20   LMP 11/29/2010   SpO2 97%   Visual Acuity Right Eye Distance:   Left Eye Distance:   Bilateral Distance:    Right Eye Near:   Left Eye Near:    Bilateral Near:     Physical Exam Vitals and nursing note reviewed.  Constitutional:      General: She is awake. She is not in acute distress.    Appearance: She is well-developed and well-groomed. She is ill-appearing.     Comments: She is sitting on the exam table in no acute distress but appears ill and is wrapped in a blanket.   HENT:     Head: Normocephalic and atraumatic.     Right Ear: Hearing, tympanic membrane, ear canal and external ear normal.     Left Ear: Hearing, tympanic membrane, ear canal and external ear normal.     Nose: Nose normal.     Right Sinus: No maxillary sinus tenderness or frontal sinus tenderness.     Left Sinus: No maxillary sinus tenderness or frontal sinus tenderness.     Mouth/Throat:     Lips: Pink.     Mouth: Mucous membranes are moist.     Pharynx: Uvula midline. Posterior oropharyngeal erythema present. No pharyngeal swelling, oropharyngeal exudate or uvula swelling.  Eyes:     Extraocular Movements: Extraocular movements intact.     Conjunctiva/sclera: Conjunctivae normal.  Cardiovascular:     Rate and Rhythm: Normal rate and regular rhythm.     Heart sounds: Normal heart sounds. No murmur heard.   Pulmonary:     Effort: Pulmonary effort is normal. No respiratory distress.     Breath sounds: Normal air entry. No decreased air movement. Examination of the right-upper field reveals rhonchi. Examination of the left-upper field reveals rhonchi. Rhonchi present. No decreased breath sounds, wheezing or rales.     Comments: Slight coarse breath sounds in upper lobes when coughing.  Musculoskeletal:     Cervical back: Normal range of motion and neck supple. No rigidity.  Lymphadenopathy:     Cervical: No  cervical adenopathy.  Skin:    General: Skin is warm and dry.     Capillary Refill: Capillary refill takes less than 2 seconds.  Neurological:     General: No focal deficit present.     Mental Status: She is alert and oriented to person, place, and time.  Psychiatric:        Mood and Affect: Mood normal.        Behavior: Behavior normal. Behavior is cooperative.        Thought Content: Thought  content normal.        Judgment: Judgment normal.      UC Treatments / Results  Labs (all labs ordered are listed, but only abnormal results are displayed) Labs Reviewed  RESP PANEL BY RT-PCR (FLU A&B, COVID) [id number]    EKG   Radiology No results found.  Procedures Procedures (including critical care time)  Medications Ordered in UC Medications - No data to display  Initial Impression / Assessment and Plan / UC Course  I have reviewed the triage vital signs and the nursing notes.  Pertinent labs & imaging results that were available during my care of the patient were reviewed by me and considered in my medical decision making (see chart for details).    Discussed with patient that she may have Influenza or COVID 19. Since abrupt onset of symptoms, will treat for Influenza since flu is increasing in the community. Start Tamiflu 75mg  twice a day as directed. Start Tessalon cough pills 1 every 8 hours as needed. Continue to push fluids. May continue Ibuprofen 600mg  every 8 hours as needed for pain/body aches. Note written for work. Follow-up pending COVID 19 and Influenza test results.   Final Clinical Impressions(s) / UC Diagnoses   Final diagnoses:  Chills  Flu-like symptoms  Cough     Discharge Instructions     Recommend start Tamiflu 75mg  twice a day as directed- may decide to stop medication if flu test is negative and COVID 19 test is positive. Recommend start Tessalon cough pills 1 every 8 hours as needed. Continue to push fluids. May continue Ibuprofen 600mg  every 8  hours as needed for pain/body aches. Follow-up pending COVID and Flu test results.     ED Prescriptions    Medication Sig Dispense Auth. Provider   oseltamivir (TAMIFLU) 75 MG capsule Take 1 capsule (75 mg total) by mouth every 12 (twelve) hours for 5 days. 10 capsule , NP   benzonatate (TESSALON) 100 MG capsule Take 1 capsule (100 mg total) by mouth 3 (three) times daily as needed for cough. 21 capsule Corydon Schweiss, , NP     PDMP not reviewed this encounter.   , NP 04/03/20 1421

## 2020-04-03 NOTE — Discharge Instructions (Addendum)
Recommend start Tamiflu 75mg  twice a day as directed- may decide to stop medication if flu test is negative and COVID 19 test is positive. Recommend start Tessalon cough pills 1 every 8 hours as needed. Continue to push fluids. May continue Ibuprofen 600mg  every 8 hours as needed for pain/body aches. Follow-up pending COVID and Flu test results.

## 2020-04-03 NOTE — ED Triage Notes (Signed)
onset Tuesday night of feeling horrible.  Complains of fever, chills, and body aching.  Patient reports a cough.

## 2020-08-20 ENCOUNTER — Encounter (HOSPITAL_COMMUNITY): Payer: Self-pay | Admitting: Emergency Medicine

## 2020-08-20 ENCOUNTER — Other Ambulatory Visit: Payer: Self-pay

## 2020-08-20 ENCOUNTER — Emergency Department (HOSPITAL_COMMUNITY): Payer: No Typology Code available for payment source

## 2020-08-20 ENCOUNTER — Emergency Department (HOSPITAL_COMMUNITY)
Admission: EM | Admit: 2020-08-20 | Discharge: 2020-08-20 | Disposition: A | Discharge status: Home / Self Care | Payer: No Typology Code available for payment source | Attending: Emergency Medicine | Admitting: Emergency Medicine

## 2020-08-20 DIAGNOSIS — J3489 Other specified disorders of nose and nasal sinuses: Secondary | ICD-10-CM | POA: Insufficient documentation

## 2020-08-20 DIAGNOSIS — R0981 Nasal congestion: Secondary | ICD-10-CM | POA: Insufficient documentation

## 2020-08-20 DIAGNOSIS — Z20822 Contact with and (suspected) exposure to covid-19: Secondary | ICD-10-CM | POA: Diagnosis not present

## 2020-08-20 DIAGNOSIS — R062 Wheezing: Secondary | ICD-10-CM | POA: Diagnosis not present

## 2020-08-20 DIAGNOSIS — R059 Cough, unspecified: Secondary | ICD-10-CM | POA: Diagnosis not present

## 2020-08-20 DIAGNOSIS — R0982 Postnasal drip: Secondary | ICD-10-CM | POA: Diagnosis not present

## 2020-08-20 DIAGNOSIS — R0602 Shortness of breath: Secondary | ICD-10-CM | POA: Diagnosis not present

## 2020-08-20 DIAGNOSIS — F17210 Nicotine dependence, cigarettes, uncomplicated: Secondary | ICD-10-CM | POA: Diagnosis not present

## 2020-08-20 LAB — POC SARS CORONAVIRUS 2 AG -  ED: SARS Coronavirus 2 Ag: NEGATIVE

## 2020-08-20 MED ORDER — ALBUTEROL SULFATE HFA 108 (90 BASE) MCG/ACT IN AERS
4.0000 | INHALATION_SPRAY | Freq: Once | RESPIRATORY_TRACT | Status: AC
  Administered 2020-08-20: 4 via RESPIRATORY_TRACT
  Filled 2020-08-20: qty 6.7

## 2020-08-20 MED ORDER — ALBUTEROL SULFATE HFA 108 (90 BASE) MCG/ACT IN AERS: 1.0000 | INHALATION_SPRAY | Freq: Four times a day (QID) | RESPIRATORY_TRACT | 1 refills | Status: DC | PRN

## 2020-08-20 MED ORDER — PREDNISONE 20 MG PO TABS
60.0000 mg | ORAL_TABLET | Freq: Once | ORAL | Status: AC
  Administered 2020-08-20: 60 mg via ORAL
  Filled 2020-08-20: qty 3

## 2020-08-20 MED ORDER — AZITHROMYCIN 250 MG PO TABS: 250.0000 mg | ORAL_TABLET | Freq: Every day | ORAL | 0 refills | Status: DC

## 2020-08-20 MED ORDER — IPRATROPIUM-ALBUTEROL 0.5-2.5 (3) MG/3ML IN SOLN
3.0000 mL | Freq: Once | RESPIRATORY_TRACT | Status: AC
  Administered 2020-08-20: 3 mL via RESPIRATORY_TRACT
  Filled 2020-08-20: qty 3

## 2020-08-20 MED ORDER — PREDNISONE 20 MG PO TABS: 40.0000 mg | ORAL_TABLET | Freq: Every day | ORAL | 0 refills | Status: AC

## 2020-08-20 MED ORDER — BENZONATATE 100 MG PO CAPS: 100.0000 mg | ORAL_CAPSULE | Freq: Three times a day (TID) | ORAL | 0 refills | Status: AC

## 2020-08-20 NOTE — ED Triage Notes (Addendum)
Pt arrives POV with complaints of shortness of breath, coughing, and wheezing since yesterday. Pt denies hx of asthma. Denies chest pain. Pt states she could possibly be exposed to mold where she lives.

## 2020-08-20 NOTE — ED Provider Notes (Signed)
[facility]-EMERGENCY DEPT Provider Note   CSN: [id number] Arrival date & time: 08/20/20  0844     History Chief Complaint  Patient presents with  . Shortness of Breath    April [name] is a 50 y.o. female with PMH/o Total hysterectomy, pelvic peritoneal adhesion who presents for evaluation of cough that has been ongoing for the last 3 weeks.  She states that her symptoms have been occurring intermittently for the last 3 weeks but felt like they worsened yesterday.  He states that she would notice she would have a cough, nasal congestion, rhinorrhea, postnasal drip.  She states that it would ease up and then returned again.  She states that since yesterday, has been more constant.  She has been coughing up phlegm.  No hemoptysis.  She states she was not able to get much sleep last night because of her cough.  She feels like when she gets in a coughing fit, she feels like she gets short of breath and cannot catch her breath.  She also feels like she is wheezing.  She has never had a history of asthma or COPD but does smoke and states that she was given inhalers previously.  She states that when she sits still or when she walks around, she does not feel short of breath.  She reports some chest soreness only occurs with coughing.  No other chest pain.  She has not noted any fevers.  She does smoke about 10 cigarettes a day.  She has not had any abdominal pain, nausea/vomiting, leg swelling.  She has not been vaccinated for COVID.  No COVID exposure that she knows of.  She does state that there is some mold around some walls where she lives and wonder if that could be contributing to her symptoms. She denies any OCP use, recent immobilization, prior history of DVT/PE, recent surgery, leg swelling, or long travel.  The history is provided by the patient.       History reviewed. No pertinent past medical history.  Patient Active Problem List   Diagnosis Date Noted  .  Status post total hysterectomy 12/01/2010  . Pelvic peritoneal adhesions, female 12/01/2010  . Hydrosalpinx 12/01/2010    Past Surgical History:  Procedure Laterality Date  . ABDOMINAL HYSTERECTOMY    . NO PAST SURGERIES       OB History   No obstetric history on file.     Family History  Problem Relation Age of Onset  . Diabetes Mother   . Hypertension Mother   . Hypertension Father   . Thyroid disease Father     Social History   Tobacco Use  . Smoking status: Current Every Day Smoker    Packs/day: 0.50    Types: Cigarettes  . Smokeless tobacco: Never Used  . Tobacco comment: every now and then  Substance Use Topics  . Alcohol use: Yes  . Drug use: Yes    Types: Marijuana    Home Medications Prior to Admission medications   Medication Sig Start Date End Date Taking? Authorizing Provider  albuterol (VENTOLIN HFA) 108 (90 Base) MCG/ACT inhaler Inhale 1-2 puffs into the lungs every 6 (six) hours as needed for wheezing or shortness of breath. 08/20/20  Yes Maxwell CaulLayden, Keirston Saephanh A, PA-C  azithromycin (ZITHROMAX) 250 MG tablet Take 1 tablet (250 mg total) by mouth daily. Take first 2 tablets together, then 1 every day until finished. 08/20/20  Yes Maxwell CaulLayden, Luree Palla A, PA-C  benzonatate (TESSALON) 100 MG  capsule Take 1 capsule (100 mg total) by mouth every 8 (eight) hours. 08/20/20  Yes Maxwell Caul, PA-C  predniSONE (DELTASONE) 20 MG tablet Take 2 tablets (40 mg total) by mouth daily for 4 days. 08/20/20 08/24/20 Yes Maxwell Caul, PA-C  ibuprofen (ADVIL) 200 MG tablet Take 200 mg by mouth every 6 (six) hours as needed.    [provider]  Phenyleph-Doxylamine-DM-APAP (ALKA SELTZER PLUS PO) Take by mouth.    [provider]    Allergies    Patient has no known allergies.  Review of Systems   Review of Systems  Constitutional: Negative for fever.  HENT: Positive for congestion, postnasal drip and rhinorrhea.   Respiratory: Positive for cough. Negative  for shortness of breath.   Cardiovascular: Negative for chest pain.  Gastrointestinal: Negative for abdominal pain, nausea and vomiting.  Neurological: Negative for headaches.  All other systems reviewed and are negative.   Physical Exam Updated Vital Signs BP (!) 174/79   Pulse 76   Temp 97.7 F (36.5 C) (Oral)   Resp 17   LMP 11/29/2010   SpO2 100%   Physical Exam Vitals and nursing note reviewed.  Constitutional:      Appearance: Normal appearance. She is well-developed.  HENT:     Head: Normocephalic and atraumatic.     Mouth/Throat:     Comments: Voice is hoarse.  No muffled phonation. Eyes:     General: Lids are normal.     Conjunctiva/sclera: Conjunctivae normal.     Pupils: Pupils are equal, round, and reactive to light.  Cardiovascular:     Rate and Rhythm: Normal rate and regular rhythm.     Pulses: Normal pulses.     Heart sounds: Normal heart sounds. No murmur heard. No friction rub. No gallop.   Pulmonary:     Effort: Pulmonary effort is normal.     Breath sounds: Wheezing and rhonchi present.     Comments: Mild expiratory wheezing noted throughout with some rhonchi.  No obvious rales noted.  Able to speak full sentences without any difficulty. Abdominal:     Palpations: Abdomen is soft. Abdomen is not rigid.     Tenderness: There is no abdominal tenderness. There is no guarding.  Musculoskeletal:        General: Normal range of motion.     Cervical back: Full passive range of motion without pain.     Comments: BLE are symmetric in appearance without any overlying warmth, erythema, edema.   Skin:    General: Skin is warm and dry.     Capillary Refill: Capillary refill takes less than 2 seconds.  Neurological:     Mental Status: She is alert and oriented to person, place, and time.  Psychiatric:        Speech: Speech normal.     ED Results / Procedures / Treatments   Labs (all labs ordered are listed, but only abnormal results are displayed) Labs  Reviewed  POC SARS CORONAVIRUS 2 AG -  ED    EKG None  Radiology DG Chest Portable 1 View  Result Date: 08/20/2020 CLINICAL DATA:  New onset cough and wheezing EXAM: PORTABLE CHEST 1 VIEW COMPARISON:  May 08, 2018 FINDINGS: The heart size and mediastinal contours are within normal limits. No focal consolidation. No significant pleural effusion or visible pneumothorax. The visualized skeletal structures are unremarkable. IMPRESSION: No active disease. Electronically Signed   By: Maudry Mayhew MD   On: 08/20/2020 09:54  Procedures Procedures   Medications Ordered in ED Medications  predniSONE (DELTASONE) tablet 60 mg (60 mg Oral Given 08/20/20 0935)  albuterol (VENTOLIN HFA) 108 (90 Base) MCG/ACT inhaler 4 puff (4 puffs Inhalation Given 08/20/20 0935)  ipratropium-albuterol (DUONEB) 0.5-2.5 (3) MG/3ML nebulizer solution 3 mL (3 mLs Nebulization Given 08/20/20 1010)    ED Course  I have reviewed the triage vital signs and the nursing notes.  Pertinent labs & imaging results that were available during my care of the patient were reviewed by me and considered in my medical decision making (see chart for details).    MDM Rules/Calculators/A&P                          50 year old female who presents for evaluation of cough, shortness of breath.  She reports this is intermittently occurred over the last 3 weeks but worsened yesterday.  No fevers.  Associated with postnasal drip, rhinorrhea, congestion.  She states her shortness of breath mostly occurs when she gets in a fit of coughing and feels like she cannot catch her breath.  No chest pain.  She does smoke.  She has not been vaccinated for COVID.  On initial arrival, she is afebrile, nontoxic-appearing.  Vitals are stable.  She is slightly hypertensive which patient states she has a history of when she is at the doctor's office.  She is slightly tachypneic.  She is not tachycardic or hypoxic.  Patient with no PE risk factors.  She  denies any pleuritic chest pain.  The shortness of breath only occurs when she is coughing.  Do not feel that this represents PE.  Additionally, history/physical exam nonconcerning for ACS etiology.  Consider viral illness versus COVID.  She is also concerned about potential mold exposure.  We will plan for chest x-ray, prednisone, albuterol.   Covid is negative. CXR shows no active disease.   Patient ambulated in the ED while maintaining O2 sats between 97-99% on RA.   Re-evaluation after albuterol, duoneb. Lungs sound improved. Wheezing improved significantly, she still has some rhonchi but improved. When she ambulated, she reports she felt like that irritated her cough, but felt like the wheezing was improved.  At this time, question of this is viral illness.  There is also question of COPD given her history of smoking which she states she has done for several years.  She has never been formally diagnosed with COPD and has never seen a pulmonologist.  We will plan to treat her with albuterol inhaler, prednisone.  Additionally, will give her azithromycin if her symptoms do not improve.  I instructed her that she will need to follow-up with the pulmonologist.  Additionally, discussed with her COVID test today is negative but she continues have symptoms, she will need repeat testing to ensure this is not COVID-19. Patient with no known drug allergies. At this time, patient exhibits no emergent life-threatening condition that require further evaluation in ED. Patient had ample opportunity for questions and discussion. All patient's questions were answered with full understanding. Strict return precautions discussed. Patient expresses understanding and agreement to plan.   Portions of this note were generated with Scientist, clinical (histocompatibility and immunogenetics). Dictation errors may occur despite best attempts at proofreading.  Final Clinical Impression(s) / ED Diagnoses Final diagnoses:  Cough    Rx / DC Orders ED  Discharge Orders         Ordered    benzonatate (TESSALON) 100 MG capsule  Every 8  hours        08/20/20 1051    albuterol (VENTOLIN HFA) 108 (90 Base) MCG/ACT inhaler  Every 6 hours PRN        08/20/20 1051    predniSONE (DELTASONE) 20 MG tablet  Daily        08/20/20 1051    azithromycin (ZITHROMAX) 250 MG tablet  Daily        08/20/20 1051           Rosana Hoes 08/20/20 1625    Cheryll Cockayne, MD 08/22/20 [phone]

## 2020-08-20 NOTE — ED Notes (Signed)
Pt ambulatory to restroom. O2 sats stayed between 97%-99% entire time. Pt still experiencing some shortness of breath with coughing. Ardeen Jourdain, PA aware.

## 2020-08-20 NOTE — Discharge Instructions (Addendum)
Use albuterol inhaler as directed.  Take prednisone as directed.  As we discussed you have no improvement in next 2 to 3 days, you can start the Z-Pak as directed.  I have also provided you Tessalon Perles that you can take to help with the cough.  As we discussed, it may be beneficial to follow-up with pulmonary given history of smoking.   As we also discussed, your imaging, test today was negative.  If you continue have symptoms, you need may need to repeat PCR testing to ensure this is not COVID-19.  Return to emergency department for any worsening cough, difficulty breathing or any other worsening concerning symptoms.

## 2020-08-26 ENCOUNTER — Telehealth: Payer: Self-pay

## 2020-08-26 NOTE — Telephone Encounter (Signed)
LVM FOR PATIENT TO RETURN CALL  Call to see how she is doing since her ER visit

## 2020-08-28 ENCOUNTER — Other Ambulatory Visit: Payer: Self-pay

## 2020-08-28 ENCOUNTER — Ambulatory Visit: Payer: No Typology Code available for payment source | Admitting: Pulmonary Disease

## 2020-08-28 ENCOUNTER — Encounter: Payer: Self-pay | Admitting: Pulmonary Disease

## 2020-08-28 VITALS — BP 130/84 | HR 98 | Temp 98.0°F | Ht 64.0 in | Wt 228.0 lb

## 2020-08-28 DIAGNOSIS — Z720 Tobacco use: Secondary | ICD-10-CM | POA: Diagnosis not present

## 2020-08-28 DIAGNOSIS — J209 Acute bronchitis, unspecified: Secondary | ICD-10-CM

## 2020-08-28 MED ORDER — ALBUTEROL SULFATE HFA 108 (90 BASE) MCG/ACT IN AERS: 1.0000 | INHALATION_SPRAY | Freq: Four times a day (QID) | RESPIRATORY_TRACT | 6 refills | Status: AC | PRN

## 2020-08-28 MED ORDER — ADVAIR HFA 230-21 MCG/ACT IN AERO: 2.0000 | INHALATION_SPRAY | Freq: Two times a day (BID) | RESPIRATORY_TRACT | 12 refills | Status: AC

## 2020-08-28 NOTE — Progress Notes (Signed)
Synopsis: Referred in April 2022 for shortness of breath by Arnette Felts, NP  Subjective:   PATIENT ID: April [name] GENDER: female DOB: [birth year], MRN: [id number]   HPI  Chief Complaint  Patient presents with  . Follow-up    Dry and productive cough with yellow mucus. Started about a year ago, when she was exposed to mold. Wheezing and sob with exertion   April [name] is a 50 year old woman, daily smoker who is referred to pulmonary clinic for cough, wheezing and shortness of breath.   She reports being sick over the past month with sinus pressure, congestion/drainage, cough, wheezing and shortness of breath along with voice hoarseness that acutely worsend on 08/20/20 which prompted her to go to an urgent care where she was started on prednisone and given an albuterol inhaler with improvement in her symptoms. She denies any episodes of worsening in her breathing like this in the past. She denies issues with wheezing or cough prior to this.   She has been living at her mother's house over the past 2 years. They have found black mold in the wall due to a leaking air conditioner unit. She reports her breathing has been fine over the past couple years until this past month.   She is a daily smoker, has been smoking since age 28. She grew up around second hand smoke from her parents. She smokes cigarettes and occasional marijuana.   Her father has emphysema and her son has asthma.   No past medical history on file.   Family History  Problem Relation Age of Onset  . Diabetes Mother   . Hypertension Mother   . Hypertension Father   . Thyroid disease Father      Social History   Socioeconomic History  . Marital status: Divorced    Spouse name: Not on file  . Number of children: Not on file  . Years of education: Not on file  . Highest education level: Not on file  Occupational History  . Not on file  Tobacco Use  . Smoking status: Current Every Day  Smoker    Packs/day: 0.50    Types: Cigarettes  . Smokeless tobacco: Never Used  . Tobacco comment: 0.5 pack per day  Substance and Sexual Activity  . Alcohol use: Yes  . Drug use: Yes    Types: Marijuana  . Sexual activity: Yes    Birth control/protection: Surgical  Other Topics Concern  . Not on file  Social History Narrative  . Not on file   Social Determinants of Health   Financial Resource Strain: Not on file  Food Insecurity: Not on file  Transportation Needs: Not on file  Physical Activity: Not on file  Stress: Not on file  Social Connections: Not on file  Intimate Partner Violence: Not on file     No Known Allergies   Outpatient Medications Prior to Visit  Medication Sig Dispense Refill  . albuterol (VENTOLIN HFA) 108 (90 Base) MCG/ACT inhaler Inhale 1-2 puffs into the lungs every 6 (six) hours as needed for wheezing or shortness of breath. 1 each 1  . benzonatate (TESSALON) 100 MG capsule Take 1 capsule (100 mg total) by mouth every 8 (eight) hours. (Patient not taking: Reported on 08/28/2020) 21 capsule 0  . ibuprofen (ADVIL) 200 MG tablet Take 200 mg by mouth every 6 (six) hours as needed. (Patient not taking: Reported on 08/28/2020)    . azithromycin (ZITHROMAX) 250 MG tablet Take 1 tablet (  250 mg total) by mouth daily. Take first 2 tablets together, then 1 every day until finished. (Patient not taking: Reported on 08/28/2020) 6 tablet 0  . Phenyleph-Doxylamine-DM-APAP (ALKA SELTZER PLUS PO) Take by mouth. (Patient not taking: Reported on 08/28/2020)     No facility-administered medications prior to visit.    Review of Systems  Constitutional: Negative for chills, fever, malaise/fatigue and weight loss.  HENT: Positive for congestion. Negative for sinus pain and sore throat.   Eyes: Negative.   Respiratory: Positive for cough, shortness of breath and wheezing. Negative for hemoptysis and sputum production.   Cardiovascular: Negative for chest pain, palpitations,  orthopnea, claudication and leg swelling.  Gastrointestinal: Negative for abdominal pain, heartburn, nausea and vomiting.  Genitourinary: Negative.   Musculoskeletal: Negative for joint pain and myalgias.  Skin: Negative for rash.  Neurological: Negative for weakness.  Endo/Heme/Allergies: Negative.   Psychiatric/Behavioral: Negative.     Objective:   Vitals:   08/28/20 1432  BP: 130/84  Pulse: 98  Temp: 98 F (36.7 C)  SpO2: 100%  Weight: 228 lb (103.4 kg)  Height: 5\' 4"  (1.626 m)     Physical Exam Constitutional:      General: She is not in acute distress.    Appearance: Normal appearance. She is not ill-appearing.  HENT:     Head: Normocephalic and atraumatic.     Nose: Nose normal.     Mouth/Throat:     Mouth: Mucous membranes are moist.     Pharynx: Oropharynx is clear.     Comments: Hoarse sounding voice Eyes:     General: No scleral icterus.    Conjunctiva/sclera: Conjunctivae normal.     Pupils: Pupils are equal, round, and reactive to light.  Cardiovascular:     Rate and Rhythm: Normal rate and regular rhythm.     Pulses: Normal pulses.     Heart sounds: Normal heart sounds. No murmur heard.   Pulmonary:     Effort: Pulmonary effort is normal.     Breath sounds: Normal breath sounds. No wheezing, rhonchi or rales.  Abdominal:     General: Bowel sounds are normal.     Palpations: Abdomen is soft.  Musculoskeletal:     Right lower leg: No edema.     Left lower leg: No edema.  Lymphadenopathy:     Cervical: No cervical adenopathy.  Skin:    General: Skin is warm and dry.  Neurological:     General: No focal deficit present.     Mental Status: She is alert.  Psychiatric:        Mood and Affect: Mood normal.        Behavior: Behavior normal.        Thought Content: Thought content normal.        Judgment: Judgment normal.       CBC    Component Value Date/Time   WBC 7.5 12/04/2019 1704   WBC 8.6 11/28/2015 1543   RBC 5.55 (H) 12/04/2019  1704   RBC 5.44 (H) 11/28/2015 1543   HGB 15.3 12/04/2019 1704   HCT 47.5 (H) 12/04/2019 1704   PLT 210 12/04/2019 1704   MCV 86 12/04/2019 1704   MCH 27.6 12/04/2019 1704   MCH 28.1 11/28/2015 1543   MCHC 32.2 12/04/2019 1704   MCHC 33.8 11/28/2015 1543   RDW 14.0 12/04/2019 1704   LYMPHSABS 2.2 12/05/2014 0320   MONOABS 0.6 12/05/2014 0320   EOSABS 0.6 12/05/2014 0320   BASOSABS 0.1 12/05/2014  0320   BMP Latest Ref Rng & Units 11/28/2015 12/05/2014 08/26/2014  Glucose 65 - 99 mg/dL 774(J) 287(O) 676(H)  BUN 6 - 20 mg/dL 11 14 13   Creatinine 0.44 - 1.00 mg/dL 2.09) 4.70(J  Sodium 135 - 145 mmol/L 136 139 138  Potassium 3.5 - 5.1 mmol/L 3.9 3.9 4.0  Chloride 101 - 111 mmol/L 103 106 106  CO2 22 - 32 mmol/L 27 25 27   Calcium 8.9 - 10.3 mg/dL 9.0 6.28) 8.7   Chest imaging: CXR 08/20/20 The heart size and mediastinal contours are within normal limits. No focal consolidation. No significant pleural effusion or visible pneumothorax. The visualized skeletal structures are unremarkable.  PFT: No flowsheet data found.    Assessment & Plan:   Acute bronchitis, unspecified organism - Plan: fluticasone-salmeterol (ADVAIR HFA) 230-21 MCG/ACT inhaler, albuterol (VENTOLIN HFA) 108 (90 Base) MCG/ACT inhaler  Tobacco use  Discussion: April [name] is a 49 year old woman, daily smoker who is referred to pulmonary clinic for cough, wheezing and shortness of breath.   She appears to have acute bronchitis secondary to viral infection with possible underlying reactive airways disease. Chest radiograph is unremarkable.  She has completed the steroid course from the urgent care with improvement in her symptoms. We will start her on ICS/LABA inahler therapy and she can continue as needed albuterol. Recommend voice rest for her hoarse voice.   We discussed smoking cessation options. Recommend using nicotine replacement therapy with patches/gum/lozenges. She should at least stop  smoking during this acute episode of illness.    Follow up in 1 month.  Ardis Hughs, MD [facility] Office: [phone]   Current Outpatient Medications:  .  fluticasone-salmeterol (ADVAIR HFA) 230-21 MCG/ACT inhaler, Inhale 2 puffs into the lungs 2 (two) times daily., Disp: 1 each, Rfl: 12 .  albuterol (VENTOLIN HFA) 108 (90 Base) MCG/ACT inhaler, Inhale 1-2 puffs into the lungs every 6 (six) hours as needed for wheezing or shortness of breath., Disp: 1 each, Rfl: 6 .  benzonatate (TESSALON) 100 MG capsule, Take 1 capsule (100 mg total) by mouth every 8 (eight) hours. (Patient not taking: Reported on 08/28/2020), Disp: 21 capsule, Rfl: 0 .  ibuprofen (ADVIL) 200 MG tablet, Take 200 mg by mouth every 6 (six) hours as needed. (Patient not taking: Reported on 08/28/2020), Disp: , Rfl:

## 2020-08-28 NOTE — Patient Instructions (Signed)
Start Advair Inhaler 2 puffs twice daily - rinse mouth out after each use  Continue to use albuterol inhaler 1-2 puffs every 4-6 hours as needed for cough, shortness of breath, wheezing or chest tightness.   Recommend resting your voice over the next few days. Hot drinks and lozenges can help.   Recommend smoking cessation - start with using a nicotine patch 7 to 14mg  daily - you can use nicotine lozenges or gum for break through cravings

## 2020-08-29 ENCOUNTER — Encounter: Payer: Self-pay | Admitting: Pulmonary Disease

## 2020-09-09 ENCOUNTER — Institutional Professional Consult (permissible substitution): Payer: No Typology Code available for payment source | Admitting: Pulmonary Disease

## 2020-09-18 ENCOUNTER — Encounter: Payer: Self-pay | Admitting: Nurse Practitioner

## 2020-09-18 ENCOUNTER — Other Ambulatory Visit: Payer: Self-pay

## 2020-09-18 ENCOUNTER — Ambulatory Visit (INDEPENDENT_AMBULATORY_CARE_PROVIDER_SITE_OTHER): Payer: Self-pay | Admitting: Nurse Practitioner

## 2020-09-18 DIAGNOSIS — R059 Cough, unspecified: Secondary | ICD-10-CM

## 2020-09-18 DIAGNOSIS — R0981 Nasal congestion: Secondary | ICD-10-CM

## 2020-09-18 DIAGNOSIS — R062 Wheezing: Secondary | ICD-10-CM

## 2020-09-18 LAB — POC COVID19 BINAXNOW: SARS Coronavirus 2 Ag: NEGATIVE

## 2020-09-18 LAB — POC INFLUENZA A&B (BINAX/QUICKVUE)
Influenza A, POC: NEGATIVE
Influenza B, POC: NEGATIVE

## 2020-09-18 MED ORDER — AZITHROMYCIN 250 MG PO TABS: ORAL_TABLET | ORAL | 0 refills | Status: AC

## 2020-09-18 NOTE — Progress Notes (Signed)
This visit occurred during the SARS-CoV-2 public health emergency.  Safety protocols were in place, including screening questions prior to the visit, additional usage of staff PPE, and extensive cleaning of exam room while observing appropriate contact time as indicated for disinfecting solutions.  Subjective:     Patient ID: April [name] , female    DOB: 07/07/1970 , 50 y.o.   MRN: [id number]   No chief complaint on file.   HPI  Patient is here for a outside visit with symptoms of cold, cough and congestion, headache and wheezing. She is not vaccinated. She is wheezing and has shortness of breath at times. She does have allergies and reports that her entire family is sick. She was treated in the ED about 2 weeks with the same symptoms and was treated with     No past medical history on file.   Family History  Problem Relation Age of Onset  . Diabetes Mother   . Hypertension Mother   . Hypertension Father   . Thyroid disease Father      Current Outpatient Medications:  .  azithromycin (ZITHROMAX Z-PAK) 250 MG tablet, Take 2 tablets (500 mg) on  Day 1,  followed by 1 tablet (250 mg) once daily on Days 2 through 5., Disp: 6 each, Rfl: 0 .  albuterol (VENTOLIN HFA) 108 (90 Base) MCG/ACT inhaler, Inhale 1-2 puffs into the lungs every 6 (six) hours as needed for wheezing or shortness of breath., Disp: 1 each, Rfl: 6 .  benzonatate (TESSALON) 100 MG capsule, Take 1 capsule (100 mg total) by mouth every 8 (eight) hours. (Patient not taking: Reported on 08/28/2020), Disp: 21 capsule, Rfl: 0 .  fluticasone-salmeterol (ADVAIR HFA) 230-21 MCG/ACT inhaler, Inhale 2 puffs into the lungs 2 (two) times daily., Disp: 1 each, Rfl: 12 .  ibuprofen (ADVIL) 200 MG tablet, Take 200 mg by mouth every 6 (six) hours as needed. (Patient not taking: Reported on 08/28/2020), Disp: , Rfl:    No Known Allergies   Review of Systems  Constitutional: Positive for chills and fatigue.  HENT: Positive  for congestion, rhinorrhea and sinus pressure.   Respiratory: Positive for cough and wheezing. Negative for chest tightness.   Cardiovascular: Negative for chest pain and palpitations.  Gastrointestinal: Negative for constipation and diarrhea.  Musculoskeletal: Positive for arthralgias.  Neurological: Positive for weakness and headaches. Negative for numbness.     Today's Vitals   There is no height or weight on file to calculate BMI.   Objective:  Physical Exam      Assessment And Plan:     1. Cough - azithromycin (ZITHROMAX Z-PAK) 250 MG tablet; Take 2 tablets (500 mg) on  Day 1,  followed by 1 tablet (250 mg) once daily on Days 2 through 5.  Dispense: 6 each; Refill: 0 - POC Influenza A&B (Binax test) - POC COVID-19  2. Sinus congestion - azithromycin (ZITHROMAX Z-PAK) 250 MG tablet; Take 2 tablets (500 mg) on  Day 1,  followed by 1 tablet (250 mg) once daily on Days 2 through 5.  Dispense: 6 each; Refill: 0 - POC Influenza A&B (Binax test) - POC COVID-19  3. Wheezing - DG Chest 2 View; Future  -Continue using albuterol and Advair HFA as needed. -I have recommended the patient to get the chest xray today and if her symptoms worsen to go the nearest emergency room.   Advised patient to take Vitamin C, D, Zinc. Keep yourself hydrated with a lot of water and  rest. Take Delsym for cough and Mucinex. Take Tylenol or pain reliever every 4-6 hours as needed for pain/fever/body ache. Educated patient if symptoms get worse or if she experiences any SOB or pain in her legs to seek immediate emergency care. Continue to monitor your pulse oxygen. Call us if you have any questions. Quarantine for 5 days if tested positive and no symptoms or 10 days if tested positive and have symptoms. Wear a mask around other people.   Follow up: if symptoms persist or do not get better.   Side effects and appropriate use of all the medication(s) were discussed with the patient today. Patient advised to  use the medication(s) as directed by their healthcare provider. The patient was encouraged to read, review, and understand all associated package inserts and contact our office with any questions or concerns. The patient accepts the risks of the treatment plan and had an opportunity to ask questions.   The patient was encouraged to call or send a message through MyChart for any questions or concerns.   Patient was given opportunity to ask questions. Patient verbalized understanding of the plan and was able to repeat key elements of the plan. All questions were answered to their satisfaction.  Raman Michaelle Bottomley, DNP   I, Raman Ruthell Feigenbaum have reviewed all documentation for this visit. The documentation on 09/11/20 for the exam, diagnosis, procedures, and orders are all accurate and complete.     THE PATIENT IS ENCOURAGED TO PRACTICE SOCIAL DISTANCING DUE TO THE COVID-19 PANDEMIC.

## 2020-10-14 ENCOUNTER — Ambulatory Visit: Payer: No Typology Code available for payment source | Admitting: Pulmonary Disease

## 2022-11-29 IMAGING — DX DG CHEST 1V PORT
1 series · 1 of 1 positions shown · non-contrast
Comparison: May 08, 2018

CLINICAL DATA: New onset cough and wheezing

EXAM:
PORTABLE CHEST 1 VIEW

[chest ap]
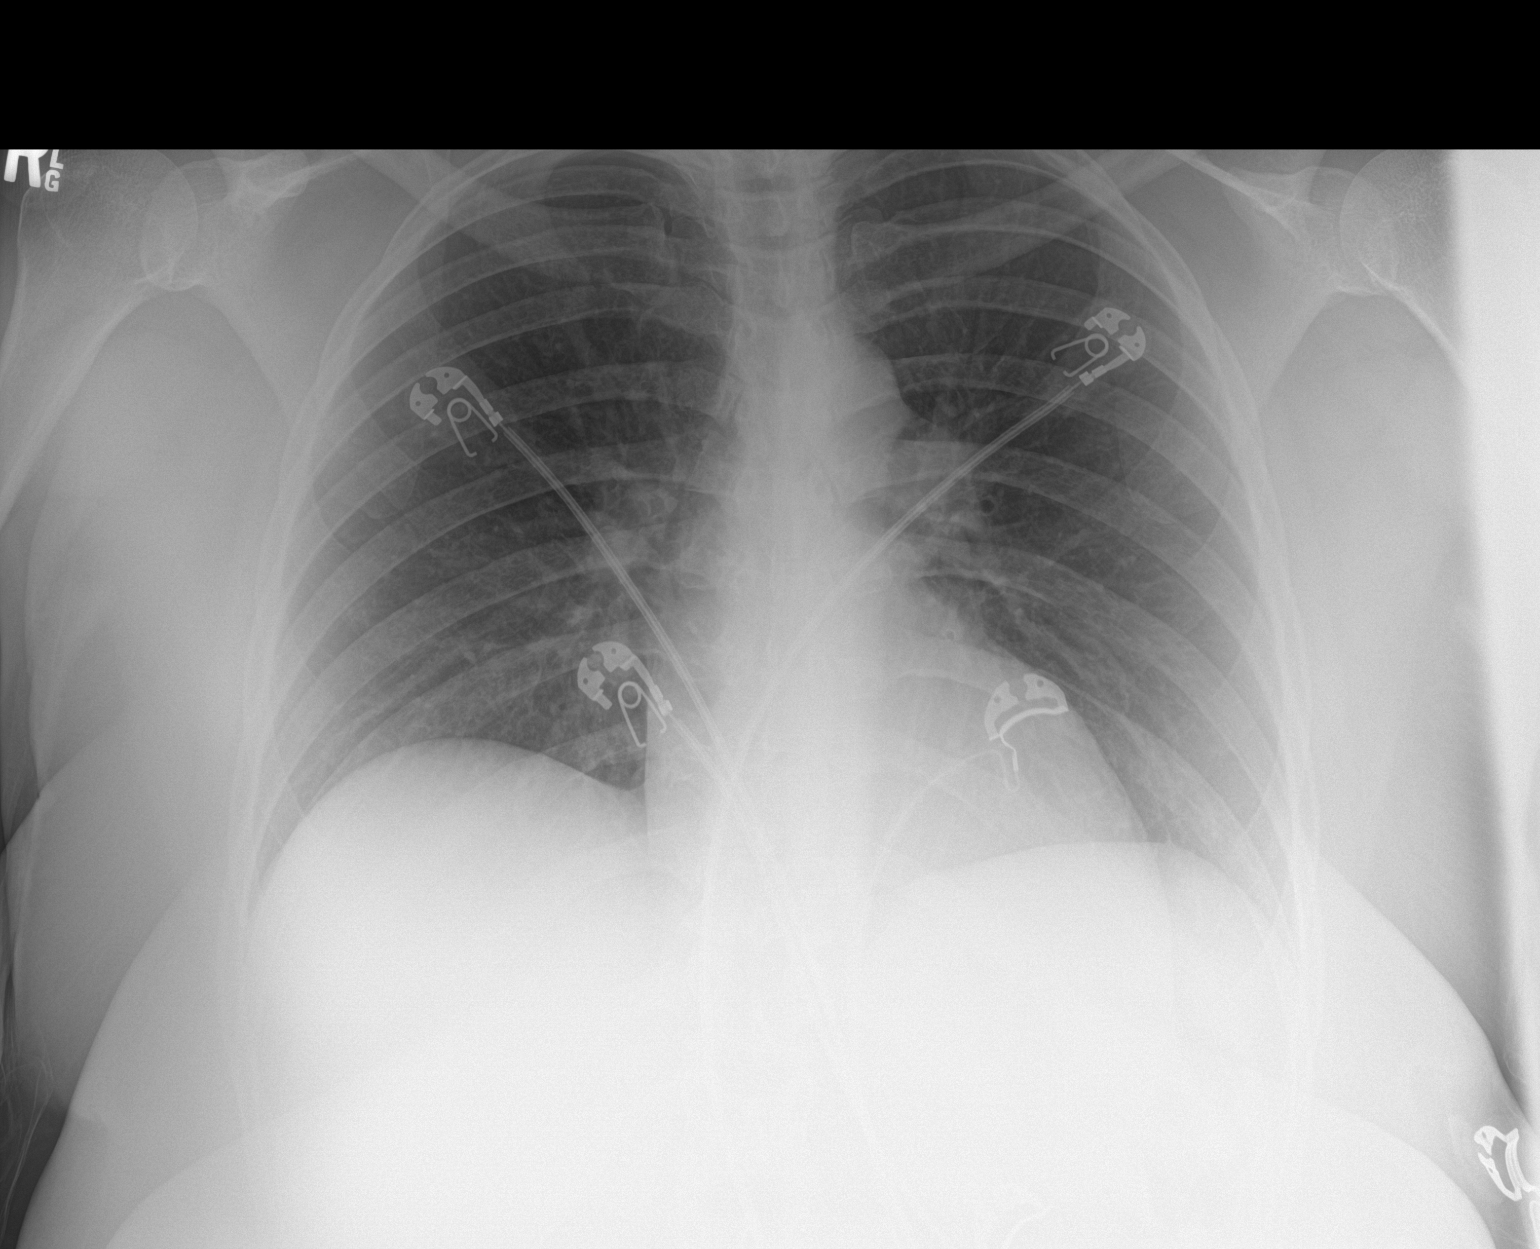

[1 of 1 positions shown; findings below may reference images not displayed]

FINDINGS: The heart size and mediastinal contours are within normal limits. No
focal consolidation. No significant pleural effusion or visible
pneumothorax. The visualized skeletal structures are unremarkable.
IMPRESSION: No active disease.

## 2024-02-22 ENCOUNTER — Ambulatory Visit
Admission: RE | Admit: 2024-02-22 | Discharge: 2024-02-22 | Disposition: A | Discharge status: Home / Self Care | Attending: Physician Assistant | Admitting: Physician Assistant

## 2024-02-22 ENCOUNTER — Other Ambulatory Visit: Payer: Self-pay

## 2024-02-22 VITALS — BP 136/73 | HR 100 | Temp 97.9°F | Resp 20 | Ht 65.0 in | Wt 230.0 lb

## 2024-02-22 DIAGNOSIS — J209 Acute bronchitis, unspecified: Secondary | ICD-10-CM | POA: Diagnosis not present

## 2024-02-22 DIAGNOSIS — R0989 Other specified symptoms and signs involving the circulatory and respiratory systems: Secondary | ICD-10-CM

## 2024-02-22 DIAGNOSIS — R051 Acute cough: Secondary | ICD-10-CM

## 2024-02-22 LAB — POC COVID19/FLU A&B COMBO
Covid Antigen, POC: NEGATIVE
Influenza A Antigen, POC: NEGATIVE
Influenza B Antigen, POC: NEGATIVE

## 2024-02-22 MED ORDER — PROMETHAZINE-DM 6.25-15 MG/5ML PO SYRP: 5.0000 mL | ORAL_SOLUTION | Freq: Four times a day (QID) | ORAL | 0 refills | Status: AC | PRN

## 2024-02-22 MED ORDER — AZITHROMYCIN 250 MG PO TABS: ORAL_TABLET | ORAL | 0 refills | Status: AC

## 2024-02-22 MED ORDER — PREDNISONE 20 MG PO TABS: 40.0000 mg | ORAL_TABLET | Freq: Every day | ORAL | 0 refills | Status: AC

## 2024-02-22 MED ORDER — IPRATROPIUM-ALBUTEROL 0.5-2.5 (3) MG/3ML IN SOLN
3.0000 mL | Freq: Once | RESPIRATORY_TRACT | Status: AC
  Administered 2024-02-22: 3 mL via RESPIRATORY_TRACT

## 2024-02-22 NOTE — ED Provider Notes (Signed)
 [facility]    CSN: [id number] Arrival date & time: 02/22/24  1615      History   Chief Complaint Chief Complaint  Patient presents with   Cough    Chest congestion, chills, hoarseness. - Entered by patient    HPI April [name] is a 53 y.o. female.  has no past medical history on file.   HPI  Discussed the use of AI scribe software for clinical note transcription with the patient, who gave verbal consent to proceed.  The patient presents with cough, chills, and wheezing.  She has been experiencing cough, chills, and wheezing for three days, starting on Sunday. She also had a fever, though it is not present today, and describes soreness from coughing. Additional symptoms include a runny nose, postnasal drip, and productive cough with phlegm. She has experienced diarrhea. No ear pain, abdominal pain, headaches, or dizziness.  She has attempted to manage symptoms with over-the-counter medications such as Mucinex  and DayQuil, but these have not provided relief. She has a history of using an inhaler due to past smoking habits, which she occasionally resumes when stressed.  Currently, she is under significant stress, caring for two parents, one with sciatica and the other with stage four cancer. Her son recently experienced similar symptoms, suggesting a possible infectious component. She expresses concern about working around children given her current symptoms.    History reviewed. No pertinent past medical history.  Patient Active Problem List   Diagnosis Date Noted   Status post total hysterectomy 12/01/2010   Pelvic peritoneal adhesions, female 12/01/2010   Hydrosalpinx 12/01/2010    Past Surgical History:  Procedure Laterality Date   ABDOMINAL HYSTERECTOMY     NO PAST SURGERIES      OB History   No obstetric history on file.      Home Medications    Prior to Admission medications   Medication Sig Start Date End Date Taking?  Authorizing Provider  azithromycin  (ZITHROMAX ) 250 MG tablet Take 500mg  PO daily x1d and then 250mg  daily x4 days 02/22/24  Yes Tasheena Wambolt E, PA-C  predniSONE  (DELTASONE ) 20 MG tablet Take 2 tablets (40 mg total) by mouth daily for 5 days. 02/22/24 02/27/24 Yes Karo Rog E, PA-C  promethazine -dextromethorphan (PROMETHAZINE -DM) 6.25-15 MG/5ML syrup Take 5 mLs by mouth 4 (four) times daily as needed. 02/22/24  Yes Gloriann Riede E, PA-C  albuterol  (VENTOLIN  HFA) 108 (90 Base) MCG/ACT inhaler Inhale 1-2 puffs into the lungs every 6 (six) hours as needed for wheezing or shortness of breath. 08/28/20   Kara Dorn NOVAK, MD  benzonatate  (TESSALON ) 100 MG capsule Take 1 capsule (100 mg total) by mouth every 8 (eight) hours. Patient not taking: Reported on 08/28/2020 08/20/20   Layden, Lindsey A, PA-C  fluticasone-salmeterol (ADVAIR  HFA) 230-21 MCG/ACT inhaler Inhale 2 puffs into the lungs 2 (two) times daily. 08/28/20   Kara Dorn NOVAK, MD  ibuprofen  (ADVIL ) 200 MG tablet Take 200 mg by mouth every 6 (six) hours as needed. Patient not taking: Reported on 08/28/2020    [provider]    Family History Family History  Problem Relation Age of Onset   Diabetes Mother    Hypertension Mother    Hypertension Father    Thyroid disease Father     Social History Social History   Tobacco Use   Smoking status: Every Day    Current packs/day: 0.50    Types: Cigarettes   Smokeless tobacco: Never   Tobacco comments:  0.5 pack per day  Vaping Use   Vaping status: Never Used  Substance Use Topics   Alcohol use: Yes   Drug use: Yes    Types: Marijuana     Allergies   Patient has no known allergies.   Review of Systems Review of Systems  Constitutional:  Positive for chills and fever.  HENT:  Positive for congestion, postnasal drip, rhinorrhea and voice change. Negative for ear pain and sore throat.   Respiratory:  Positive for cough and wheezing. Negative for shortness of breath.    Gastrointestinal:  Positive for diarrhea. Negative for abdominal pain, nausea and vomiting.  Musculoskeletal:  Positive for myalgias (chest and back soreness).  Neurological:  Negative for dizziness, light-headedness and headaches.     Physical Exam Triage Vital Signs ED Triage Vitals  Encounter Vitals Group     BP 02/22/24 1636 136/73     Girls Systolic BP Percentile --      Girls Diastolic BP Percentile --      Boys Systolic BP Percentile --      Boys Diastolic BP Percentile --      Pulse Rate 02/22/24 1636 100     Resp 02/22/24 1636 20     Temp 02/22/24 1636 97.9 F (36.6 C)     Temp Source 02/22/24 1636 Oral     SpO2 02/22/24 1636 98 %     Weight 02/22/24 1636 230 lb (104.3 kg)     Height 02/22/24 1636 5' 5 (1.651 m)     Head Circumference --      Peak Flow --      Pain Score 02/22/24 1705 2     Pain Loc --      Pain Education --      Exclude from Growth Chart --    No data found.  Updated Vital Signs BP 136/73 (BP Location: Right Arm)   Pulse 100   Temp 97.9 F (36.6 C) (Oral)   Resp 20   Ht 5' 5 (1.651 m)   Wt 230 lb (104.3 kg)   LMP 11/27/2010   SpO2 98%   BMI 38.27 kg/m   Visual Acuity Right Eye Distance:   Left Eye Distance:   Bilateral Distance:    Right Eye Near:   Left Eye Near:    Bilateral Near:     Physical Exam Vitals reviewed.  Constitutional:      General: She is awake. She is not in acute distress.    Appearance: Normal appearance. She is well-developed and well-groomed. She is not ill-appearing, toxic-appearing or diaphoretic.  HENT:     Head: Normocephalic and atraumatic.     Right Ear: Hearing, tympanic membrane and ear canal normal.     Left Ear: Hearing, tympanic membrane and ear canal normal.     Mouth/Throat:     Lips: Pink.     Mouth: Mucous membranes are moist.     Pharynx: Oropharynx is clear. Uvula midline. No pharyngeal swelling, oropharyngeal exudate, posterior oropharyngeal erythema, uvula swelling or postnasal  drip.  Neck:     Comments: Patient has hoarse voice   Cardiovascular:     Rate and Rhythm: Normal rate and regular rhythm.     Pulses: Normal pulses.          Radial pulses are 2+ on the right side and 2+ on the left side.     Heart sounds: Normal heart sounds. No murmur heard.    No friction rub. No gallop.  Pulmonary:     Effort: Pulmonary effort is normal. No tachypnea, bradypnea, accessory muscle usage, prolonged expiration, respiratory distress or retractions.     Breath sounds: No decreased air movement. Rhonchi and rales present. No decreased breath sounds or wheezing.  Musculoskeletal:     Cervical back: Normal range of motion and neck supple.  Lymphadenopathy:     Head:     Right side of head: No submental, submandibular or preauricular adenopathy.     Left side of head: No submental, submandibular or preauricular adenopathy.     Cervical:     Right cervical: No superficial cervical adenopathy.    Left cervical: No superficial cervical adenopathy.     Upper Body:     Right upper body: No supraclavicular adenopathy.     Left upper body: No supraclavicular adenopathy.  Skin:    General: Skin is warm and dry.  Neurological:     General: No focal deficit present.     Mental Status: She is alert and oriented to person, place, and time.  Psychiatric:        Mood and Affect: Mood normal.        Behavior: Behavior normal. Behavior is cooperative.        Thought Content: Thought content normal.        Judgment: Judgment normal.      UC Treatments / Results  Labs (all labs ordered are listed, but only abnormal results are displayed) Labs Reviewed  POC COVID19/FLU A&B COMBO    EKG   Radiology No results found.  Procedures Procedures (including critical care time)  Medications Ordered in UC Medications  ipratropium-albuterol  (DUONEB) 0.5-2.5 (3) MG/3ML nebulizer solution 3 mL (3 mLs Nebulization Given 02/22/24 1823)    Initial Impression / Assessment and Plan  / UC Course  I have reviewed the triage vital signs and the nursing notes.  Pertinent labs & imaging results that were available during my care of the patient were reviewed by me and considered in my medical decision making (see chart for details).     Rhonchi and rales significantly decreased following DuoNeb administration.  Patient reports that she is able to breathe a little bit easier following breathing treatment Final Clinical Impressions(s) / UC Diagnoses   Final diagnoses:  Acute cough  Rhonchi  Symptoms of upper respiratory infection (URI)  Acute bronchitis, unspecified organism   Acute Bronchitis Symptoms consistent with acute bronchitis, including cough, wheezing, chills, and phlegm production for three days. COVID-19 and influenza tests are negative. Smoking may contribute to respiratory symptoms. Stress from caregiving responsibilities may exacerbate symptoms. Differential diagnosis includes bronchitis or another form of respiratory inflammation. - Administer breathing treatment to assess response-breathing and wheezing symptoms improved Will send in prescription for Z-Pak and prednisone  40 mg p.o. daily x 5-day burst.  Will also send in promethazine -dextromethorphan cough syrup to further assist with symptom management - Advise to refrain from work until Friday to recover and reduce contagion risk ED and return precautions reviewed and provided in AVS.  Follow-up as needed  Smoking Occasional smoking during periods of stress. Smoking cessation is important to prevent exacerbation of respiratory symptoms and improve overall lung health.   Discharge Instructions      VISIT SUMMARY:  You came in today with symptoms of cough, chills, and wheezing that have been present for three days. You also had a fever, runny nose, postnasal drip, and a productive cough with phlegm. You have been under significant stress caring for your parents  and have a history of occasional smoking.  Your son recently had similar symptoms, suggesting a possible infection. COVID-19 and influenza tests were negative.  YOUR PLAN:  -ACUTE BRONCHITIS: Acute bronchitis is an inflammation of the bronchial tubes in the lungs, often causing cough, wheezing, and phlegm production. We administered a breathing treatment to see if your wheezing improves.  I have sent in a prescription for a prednisone  burst for you to take by mouth once every morning for 5 days.  I recommend taking this with breakfast as steroids can cause increased hunger, elevated blood sugars and increased irritability but these should start to reduce once you stop taking medication.  I am also sending in a prescription for an antibiotic called azithromycin , also known as a Z-Pak.  This medication helps reduce pulmonary inflammation as well as provide antibiotic coverage for potential bacterial pathogens that may be causing her symptoms.  We will also prescribe cough syrup to help alleviate your cough. Please refrain from work until Friday to recover and reduce the risk of spreading any infection.  -SMOKING: Smoking, even occasionally, can worsen respiratory symptoms and overall lung health. It is important to work on quitting smoking to prevent further respiratory issues and improve your overall health.  INSTRUCTIONS:  Please follow up with us  if your symptoms do not improve or if they worsen. Refrain from work until Friday to ensure you have adequate time to recover and to reduce the risk of spreading any infection.     ED Prescriptions     Medication Sig Dispense Auth. Provider   promethazine -dextromethorphan (PROMETHAZINE -DM) 6.25-15 MG/5ML syrup Take 5 mLs by mouth 4 (four) times daily as needed. 118 mL Jaben Benegas E, PA-C   azithromycin  (ZITHROMAX ) 250 MG tablet Take 500mg  PO daily x1d and then 250mg  daily x4 days 6 each Milah Recht E, PA-C   predniSONE  (DELTASONE ) 20 MG tablet Take 2 tablets (40 mg total) by mouth daily for 5  days. 10 tablet Montrell Cessna E, PA-C      PDMP not reviewed this encounter.   Marylene Rocky BRAVO, PA-C 02/22/24 1949

## 2024-02-22 NOTE — ED Triage Notes (Signed)
 Pt presents with complaints of productive cough, chills, fevers, congestion, and diarrhea x 4 days. Pt currently rates overall pain a 2/10. Feels very sore in the rib area when coughing. Pt states her son is sick with similar symptoms. Has been taking OTC Mucinex  + Dayquil at home with no improvement/relief.

## 2024-02-22 NOTE — Discharge Instructions (Addendum)
 VISIT SUMMARY:  You came in today with symptoms of cough, chills, and wheezing that have been present for three days. You also had a fever, runny nose, postnasal drip, and a productive cough with phlegm. You have been under significant stress caring for your parents and have a history of occasional smoking. Your son recently had similar symptoms, suggesting a possible infection. COVID-19 and influenza tests were negative.  YOUR PLAN:  -ACUTE BRONCHITIS: Acute bronchitis is an inflammation of the bronchial tubes in the lungs, often causing cough, wheezing, and phlegm production. We administered a breathing treatment to see if your wheezing improves.  I have sent in a prescription for a prednisone  burst for you to take by mouth once every morning for 5 days.  I recommend taking this with breakfast as steroids can cause increased hunger, elevated blood sugars and increased irritability but these should start to reduce once you stop taking medication.  I am also sending in a prescription for an antibiotic called azithromycin , also known as a Z-Pak.  This medication helps reduce pulmonary inflammation as well as provide antibiotic coverage for potential bacterial pathogens that may be causing her symptoms.  We will also prescribe cough syrup to help alleviate your cough. Please refrain from work until Friday to recover and reduce the risk of spreading any infection.  -SMOKING: Smoking, even occasionally, can worsen respiratory symptoms and overall lung health. It is important to work on quitting smoking to prevent further respiratory issues and improve your overall health.  INSTRUCTIONS:  Please follow up with us  if your symptoms do not improve or if they worsen. Refrain from work until Friday to ensure you have adequate time to recover and to reduce the risk of spreading any infection.
